# Patient Record
Sex: Male | Born: 1960 | Race: White | Hispanic: No | Marital: Married
Health system: Midwestern US, Academic
[De-identification: ages and names within clinical notes are randomized; demographics above are authoritative.]

---

## 2017-04-14 ENCOUNTER — Encounter: Admit: 2017-04-14 | Discharge: 2017-04-14 | Payer: Private health insurance—other commercial Indemnity

## 2017-04-14 ENCOUNTER — Ambulatory Visit: Admit: 2017-04-14 | Discharge: 2017-04-14 | Payer: BC Managed Care – PPO

## 2017-04-14 DIAGNOSIS — E785 Hyperlipidemia, unspecified: ICD-10-CM

## 2017-04-14 DIAGNOSIS — I10 Essential (primary) hypertension: Principal | ICD-10-CM

## 2017-04-14 DIAGNOSIS — Z87891 Personal history of nicotine dependence: ICD-10-CM

## 2017-04-14 DIAGNOSIS — E119 Type 2 diabetes mellitus without complications: Principal | ICD-10-CM

## 2017-04-14 DIAGNOSIS — G4730 Sleep apnea, unspecified: ICD-10-CM

## 2017-04-14 MED ORDER — METFORMIN 500 MG PO TB24
ORAL_TABLET | 3 refills | Status: AC
Start: 2017-04-14 — End: 2018-04-30

## 2017-04-14 NOTE — Progress Notes
Date of Service: 04/14/2017     Subjective:             Jeremy Bullock is a 56 y.o. male. He is here with his wife today.    History of Present Illness    Pt presents to clinic for management of T2 DM. This is the 6 mo f/u appointment.  He was last seen on 11/25/16    Interval changes:  He continues to do cardiac rehab 2 days weekly and has no new complaints since last visit.  A1C is 6.1 today, was previously 6.0 last visit.  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  Wt today is 302 lbs.  Was previously 306  Brought meter for review. I've reviewed meter download and BG patterns with pt- see printout in Careers information officer.   Check fasting daily.  Fasting BGs range 87-123, average 107 (SD 9)  Current glucose is 215, just ate lunch       T2 DM:   Dx:  2017  Current treatment: metformin XR 1000 mg BID  Medication adherent: all of the time   Past treatment: used lantus and novolog while inpt after CABG  SMBG and consistency: Checking daily am fasting, one touch verio  Hyperglycemia: none documented  Hypoglycemia: none on meter.  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   CHO intake:  Continues low carb meal plan  Exercise: yes, cardiac rehab and active at work and home.  Wears fitbit    Complications of DM:  CAD: yes, CABG in 2017  CVA: no  PVD: no  Amputations: no  Retinopathy: no  Gastropathy: no  Nephropathy: no  Neuropathy: no    Last dental exam within 6 mo: due now   Last eye exam within 12 mo:  Yes, 3/18    Medications:  Statin: yes  ACE-I or ARB: yes  ASA: yes    Past Medical History:   Diagnosis Date   ??? Dyslipidemia 05/22/2009   ??? Essential (primary) hypertension 05/22/2009   ??? History of tobacco use 05/22/2009   ??? Sleep apnea 05/22/2009     Past Surgical History:   Procedure Laterality Date ??? PERCUTANEOUS CORONARY INTERVENTION N/A 01/01/2016    Possible Percutaneous Coronary Intervention performed by Physician Cath at [facility]   ??? CORONARY ARTERY BYPASS GRAFT N/A 01/04/2016    BYPASS GRAFT CORONARY ARTERY X3,  EVH, LIMA performed by Hendricks Milo, MD at [facility]   ??? CORONARY ARTERY BYPASS GRAFT  01/04/16    three vessel   ??? PR COLONOSCOPY FLX DX W/COLLJ SPEC WHEN PFRMD N/A 01/08/2016    COLONOSCOPY performed by Everardo All, MD at [facility]     Family History   Problem Relation Age of Onset   ??? High Cholesterol Mother    ??? Diabetes Father    ??? Coronary Artery Disease Father      Social History     Social History   ??? Marital status: Married     Spouse name: N/A   ??? Number of children: N/A   ??? Years of education: N/A     Occupational History   ??? Not on file.     Social History Main Topics   ??? Smoking status: Never Smoker   ???  Smokeless tobacco: Never Used   ??? Alcohol use 0.6 - 1.2 oz/week     1 - 2 Cans of beer per week      Comment: occasionally   ??? Drug use: No   ??? Sexual activity: Not on file     Other Topics Concern   ??? Not on file     Social History Narrative   ??? No narrative on file       Labs:  Glucose, POC   Date/Time Value Ref Range Status   11/25/2016 11:07 AM 109  Final     Cholesterol   Date/Time Value Ref Range Status   11/25/2016 11:58 AM 90 <200 MG/DL Final     HDL   Date/Time Value Ref Range Status   11/25/2016 11:58 AM 39 (L) >40 MG/DL Final     LDL   Date/Time Value Ref Range Status   11/25/2016 11:58 AM 50 <100 MG/DL Final     Triglycerides   Date/Time Value Ref Range Status   11/25/2016 11:58 AM 66 <150 MG/DL Final     Non HDL Cholesterol   Date/Time Value Ref Range Status   11/25/2016 11:58 AM 51 MG/DL Final     Comment:     Calculated non-HDL Cholesterol (non-HDL-C) indirectly measures LDL-C, Lp(a),   IDL-C, and VLDL-C.  It is a surrogate marker for Apoprotein B.  Goal should be   less than 130 mg/dL.       Potassium   Date/Time Value Ref Range Status 11/25/2016 11:58 AM 4.4 3.5 - 5.1 MMOL/L Final     Blood Urea Nitrogen   Date/Time Value Ref Range Status   11/25/2016 11:58 AM 17 7 - 25 MG/DL Final     Creatinine   Date/Time Value Ref Range Status   11/25/2016 11:58 AM 1.17 0.4 - 1.24 MG/DL Final     AST (SGOT)   Date/Time Value Ref Range Status   11/25/2016 11:58 AM 17 7 - 40 U/L Final     ALT (SGPT)   Date/Time Value Ref Range Status   11/25/2016 11:58 AM 23 7 - 56 U/L Final     TSH   Date/Time Value Ref Range Status   11/25/2016 11:58 AM 1.173 0.35 - 5.00 MCU/ML Final     Vitamin D(25-OH)Total   Date/Time Value Ref Range Status   11/25/2016 11:58 AM 30.9 30 - 80 NG/ML Final            Review of Systems   Constitutional: Negative.    Respiratory: Negative for chest tightness and shortness of breath.    Cardiovascular: Positive for leg swelling (occasional). Negative for chest pain and palpitations.   Gastrointestinal: Negative for diarrhea and nausea.   Genitourinary: Negative for difficulty urinating.   All other systems reviewed and are negative.        Objective:         ??? aspirin EC 81 mg tablet Take 1 Tab by mouth daily. Take with food.   ??? atorvastatin (LIPITOR) 40 mg tablet Take 1 Tab by mouth daily.   ??? blood sugar diagnostic test strip 1 Strip twice daily before meals.   ??? metFORMIN-XR(+) (GLUCOPHAGE XR) 500 mg extended release tablet Take 2 tablets by mouth twice daily. Indications: type 2 diabetes mellitus   ??? ONE TOUCH DELICA 33 gauge MISC Use 1 Each as directed twice daily as needed. E11.65  Collaborating MD:  Dr. Henrine Screws   ??? sertraline (ZOLOFT) 50 mg tablet TAKE ONE TABLET  BY MOUTH ONCE DAILY   ??? valsartan-hydrochlorothiazide (DIOVAN HCT) 160-25 mg tablet Take 1 Tab by mouth daily.   ??? verapamil  SR (CALAN-SR) 180 mg tablet Take 1 Tab by mouth daily.     Vitals:    04/14/17 1255   BP: 119/66   Pulse: 68   Weight: (!) 137.1 kg (302 lb 3.2 oz)   Height: 185.4 cm (73)     Body mass index is 39.87 kg/m???.     Physical Exam Constitutional: He is oriented to person, place, and time. He appears well-developed and well-nourished.   HENT:   Head: Normocephalic and atraumatic.   Eyes: Conjunctivae and EOM are normal.   Neck: Normal range of motion.   Cardiovascular: Normal heart sounds.    Pulmonary/Chest: Effort normal and breath sounds normal.   Abdominal: Soft. Bowel sounds are normal. He exhibits no distension. There is no tenderness.   No lipohypertrophy noted in abd tissue   Musculoskeletal: Normal range of motion. He exhibits no edema.   Neurological: He is alert and oriented to person, place, and time.   Skin: Skin is warm and dry.   Has bruising from being kicked by a calf on left posterior lower extremity    Psychiatric: He has a normal mood and affect. His behavior is normal.     Diabetic foot exam on 11/25/16     Assessment and Plan:  1.T2 DM,  controlled . A1C is 6.1 today, was previously 6.0 last visit. 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  The body mass index is 39.87 kg/m???. 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    3. Hypertension at goal less than 130/80.  4. Lipid Continue lipitor 40 mg daily.  Repeat labs in January 2019.  5.  Diabetes microvascular complications: no history of retinopathy and MACR was normal November 25, 2016.      RTC in 6 mo      Total time 30 minutes.  Estimated counseling time 25 minutes.  Counseled patient regarding diabetes management.      Orders Placed This Encounter   ??? GLUCOSE METER DOWNLOAD   ??? POC HEMOGLOBIN A1C   ??? POC GLUCOSE QUANTITATIVE BLOOD   ??? metFORMIN-XR(+) (GLUCOPHAGE XR) 500 mg extended release tablet Patient Instructions     It was nice to see you today!    Goals we discussed today:    Nice to meet you today!  Decrease metformin to 1 tablet in the morning and 2 tablets at dinner    Please contact [facility] for any questions or abnormal glucose values (above 300 or less than 70 repeatedly).  [phone]   My nurse, Roanna Raider, can be reached at [phone]      Health goals for a person with diabetes to avoid complications are:    Check your FingerStick blood glucose:  Each time you take medications for your glucose, goals are:   Fasting and pre-meal glucose:  70-130 mg/dl   Bedtime blood glucose: 90-180 mg/dl    LDL Cholesterol:  <  100 mg/dl   Triglycerides: <161 mg/dl   HDL >09 for men, >60 for women   Blood pressure:  Less than 130/90 mm HG  Maintain a Healthy Weight , Exercise: 30 minutes 5 times per week    Foot care:  Take good care of your feet.  Never cut your nails close to the tips of the toes. Promptly call your doctor if you have an infection, ulcer or cut anywhere on your feet. Wear shoes with a wide toe box and that fit comfortably.  Avoid walking barefoot.     Your recent lab values:    Glucose, POC   Date/Time Value Ref Range Status   11/25/2016 11:07 AM 109  Final     Cholesterol   Date/Time Value Ref Range Status   11/25/2016 11:58 AM 90 <200 MG/DL Final     HDL   Date/Time Value Ref Range Status   11/25/2016 11:58 AM 39 (L) >40 MG/DL Final     LDL   Date/Time Value Ref Range Status   11/25/2016 11:58 AM 50 <100 MG/DL Final     Triglycerides   Date/Time Value Ref Range Status   11/25/2016 11:58 AM 66 <150 MG/DL Final          Diabetes Support Group  Monthly DM Support Group facilitated by CDE. There is no fee or  Registration required for attendance. Meets the first Wednesday of each month from 6-7:30pm at the [facility], [address], [address], [place], [place]  [address].    [facility] To schedule Diabetes Education please call [phone], Option 3  Contact your insurance company to determine coverage for diabetes education.            No future appointments.

## 2017-06-06 ENCOUNTER — Encounter: Admit: 2017-06-06 | Discharge: 2017-06-06 | Payer: Private Health Insurance - Indemnity

## 2017-06-07 MED ORDER — ONETOUCH VERIO MISC STRP
ORAL_STRIP | Freq: Two times a day (BID) | 2 refills | 30.00000 days | Status: AC
Start: 2017-06-07 — End: 2020-01-31

## 2017-06-14 ENCOUNTER — Encounter: Admit: 2017-06-14 | Discharge: 2017-06-14 | Payer: Private health insurance—other commercial Indemnity

## 2017-06-19 MED ORDER — ONETOUCH VERIO MISC STRP
ORAL_STRIP | Freq: Two times a day (BID) | 2 refills | 30.00000 days | Status: AC
Start: 2017-06-19 — End: 2020-01-31

## 2017-07-13 ENCOUNTER — Encounter: Admit: 2017-07-13 | Discharge: 2017-07-13 | Payer: Private Health Insurance - Indemnity

## 2017-07-13 ENCOUNTER — Ambulatory Visit: Admit: 2017-07-13 | Discharge: 2017-07-14 | Payer: BC Managed Care – PPO

## 2017-07-13 ENCOUNTER — Encounter: Admit: 2017-07-13 | Discharge: 2017-07-14

## 2017-07-13 DIAGNOSIS — E785 Hyperlipidemia, unspecified: ICD-10-CM

## 2017-07-13 DIAGNOSIS — I2510 Atherosclerotic heart disease of native coronary artery without angina pectoris: ICD-10-CM

## 2017-07-13 DIAGNOSIS — G4733 Obstructive sleep apnea (adult) (pediatric): ICD-10-CM

## 2017-07-13 DIAGNOSIS — Z87891 Personal history of nicotine dependence: ICD-10-CM

## 2017-07-13 DIAGNOSIS — G4730 Sleep apnea, unspecified: ICD-10-CM

## 2017-07-13 DIAGNOSIS — R9389 Abnormal findings on diagnostic imaging of other specified body structures: Principal | ICD-10-CM

## 2017-07-13 DIAGNOSIS — I10 Essential (primary) hypertension: Principal | ICD-10-CM

## 2017-07-13 DIAGNOSIS — R69 Illness, unspecified: Principal | ICD-10-CM

## 2017-07-13 NOTE — Assessment & Plan Note
Blood pressure treated to goal on current medical program.

## 2017-07-13 NOTE — Progress Notes
Date of Service: 07/13/2017    Jeremy Bullock is a 56 y.o. male.       HPI     Jeremy Bullock was in the [place] office today with his wife.  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    I think he is doing well from a cardiovascular standpoint.  He is being followed down at the [facility] and his hemoglobin A1c levels have definitely improved.    He is still doing Phase 3 Cardiac Rehab occasionally.  He obviously works really hard and he says that he has had none of the chest tightness that he had prior to his bypass operation last year.    He denies any TIA or stroke symptoms.  He has had no palpitations, syncope, or near syncope.    He does describe some sense of leg discomfort and movement during the last couple of hours of sleep.  Little bit of an unusual symptom for restless legs but I think that is probably what he is describing and I suggested that he talk to Dr. Doran Heater about it.    He has undergone some type of screening pulmonary testing at work and there is some concern about an abnormal chest x-ray and abnormal spirometry.  He is planning to have a CT chest later this afternoon.         Vitals:    07/13/17 0830 07/13/17 0836   BP: 132/84 130/88   Pulse: 60    Weight: (!) 140.5 kg (309 lb 12.8 oz)    Height: 1.854 m (6' 1)      Body mass index is 40.87 kg/m???.     Past Medical History  Patient Active Problem List    Diagnosis Date Noted   ??? Abnormal chest x-ray 07/13/2017   ??? Coronary artery disease due to calcified coronary lesion 02/11/2016     01/2016 - CABG     ??? Type 2 diabetes mellitus (HCC) 02/19/2015     07/2014 - Fasting glucose 162.  Hgb A1c 7.4%       ??? Morbid obesity (HCC) 08/02/2011   ??? Essential (primary) hypertension 05/22/2009   ??? History of tobacco use 05/22/2009   ??? Dyslipidemia 05/22/2009     borderline     ??? Sleep apnea 05/22/2009 2004 - Diagnosis by sleep study at [facility], ordered by Dr. Gwenette Greet.  CPAP started.    09/2012 - Office consultation with Dr. Lucilla Lame.  Follow-up sleep study scheduled for 10/2012.           Review of Systems   Constitution: Negative.   HENT: Negative.    Eyes: Negative.    Cardiovascular: Positive for claudication.   Respiratory: Negative.    Endocrine: Negative.    Hematologic/Lymphatic: Negative.    Skin: Negative.    Musculoskeletal: Negative.    Gastrointestinal: Negative.    Genitourinary: Negative.    Neurological: Negative.    Psychiatric/Behavioral: Negative.    Allergic/Immunologic: Negative.        Physical Exam    Physical Exam   General Appearance: no distress   Skin: warm, no ulcers or xanthomas   Digits and Nails: no cyanosis or clubbing   Eyes: conjunctivae and lids normal, pupils are equal and round   Teeth/Gums/Palate: dentition unremarkable, no lesions   Lips & Oral Mucosa: no pallor or cyanosis  Neck Veins: normal JVP , neck veins are not distended   Thyroid: no nodules, masses, tenderness or enlargement   Chest Inspection: chest is normal in appearance   Respiratory Effort: breathing comfortably, no respiratory distress   Auscultation/Percussion: lungs clear to auscultation, no rales or rhonchi, no wheezing   PMI: PMI not enlarged or displaced   Cardiac Rhythm: regular rhythm and normal rate   Cardiac Auscultation: S1, S2 normal, no rub, no gallop   Murmurs: no murmur   Peripheral Circulation: normal peripheral circulation   Carotid Arteries: normal carotid upstroke bilaterally, no bruits   Radial Arteries: normal symmetric radial pulses   Abdominal Aorta: no abdominal aortic bruit   Pedal Pulses: normal symmetric pedal pulses   Lower Extremity Edema: no lower extremity edema   Abdominal Exam: soft, non-tender, no masses, bowel sounds normal   Liver & Spleen: no organomegaly   Gait & Station: walks without assistance   Muscle Strength: normal muscle tone Orientation: oriented to time, place and person   Affect & Mood: appropriate and sustained affect   Language and Memory: patient responsive and seems to comprehend information   Neurologic Exam: neurological assessment grossly intact   Other: moves all extremities      Problems Addressed Today  Encounter Diagnoses   Name Primary?   ??? Abnormal chest x-ray    ??? Coronary artery disease due to calcified coronary lesion    ??? Dyslipidemia    ??? Essential (primary) hypertension    ??? Obstructive sleep apnea syndrome        Assessment and Plan       Abnormal chest x-ray  He has some sort of chest x-ray abnormality and abnormal PFT's for which he's having a CT-chest later today.  He'll probably need to get in to see Dr. Doran Heater for follow-up.    Coronary artery disease due to calcified coronary lesion  We talked at some length today about how to monitor for progression of his coronary disease.  We will also little surprised that the extent of his disease when he underwent his preoperative catheterization but in retrospect he probably was having fairly typical angina symptoms for a year.  Stress testing was never particularly predictive for him and he thinks it is because they did not working hard enough on the treadmill compared to what he would do on his own out of the real world.    We left it that we would simply monitor him for a return of his chest discomfort symptoms with exertion.  Should this happen he is to count patient that I would probably refer for catheterization at that point rather than doing a stress test.    Dyslipidemia  Lab Results   Component Value Date    CHOL 90 11/25/2016    TRIG 66 11/25/2016    HDL 39 (L) 11/25/2016    LDL 50 11/25/2016    VLDL 13 11/25/2016    NONHDLCHOL 51 11/25/2016    CHOLHDLC 4 02/25/2015      LDL treated to goal.    Essential (primary) hypertension  Blood pressure treated to goal on current medical program.    Sleep apnea It sounds like his CPAP device is working well for him but he seems to have some type of a restless legs problem but I have encouraged him to talk to Dr. Doran Heater about.      Current Medications (including today's revisions)  ??? aspirin EC 81 mg tablet Take 1 Tab by mouth daily.  Take with food.   ??? atorvastatin (LIPITOR) 40 mg tablet Take 1 Tab by mouth daily.   ??? metFORMIN-XR(+) (GLUCOPHAGE XR) 500 mg extended release tablet 500 mg in the morning with breakfast  1000 mg in the evening with dinner   ??? ONE TOUCH DELICA 33 gauge MISC Use 1 Each as directed twice daily as needed. E11.65  Collaborating MD:  Dr. Henrine Screws   ??? ONETOUCH VERIO test strip USE ONE STRIP TO CHECK GLUCOSE TWICE DAILY BEFORE MEAL(S)   ??? ONETOUCH VERIO test strip USE ONE STRIP TO CHECK GLUCOSE TWICE DAILY BEFORE MEAL(S)   ??? sertraline (ZOLOFT) 50 mg tablet TAKE ONE TABLET BY MOUTH ONCE DAILY   ??? valsartan-hydrochlorothiazide (DIOVAN HCT) 160-25 mg tablet Take 1 Tab by mouth daily.   ??? verapamil  SR (CALAN-SR) 180 mg tablet Take 1 Tab by mouth daily.

## 2017-07-13 NOTE — Assessment & Plan Note
It sounds like his CPAP device is working well for him but he seems to have some type of a restless legs problem but I have encouraged him to talk to Dr. Doran HeaterMitchem about.

## 2017-07-13 NOTE — Assessment & Plan Note
He has some sort of chest x-ray abnormality and abnormal PFT's for which he's having a CT-chest later today.  He'll probably need to get in to see Dr. Doran HeaterMitchem for follow-up.

## 2017-07-13 NOTE — Assessment & Plan Note
We talked at some length today about how to monitor for progression of his coronary disease.  We will also little surprised that the extent of his disease when he underwent his preoperative catheterization but in retrospect he probably was having fairly typical angina symptoms for a year.  Stress testing was never particularly predictive for him and he thinks it is because they did not working hard enough on the treadmill compared to what he would do on his own out of the real world.    We left it that we would simply monitor him for a return of his chest discomfort symptoms with exertion.  Should this happen he is to count patient that I would probably refer for catheterization at that point rather than doing a stress test.

## 2017-07-13 NOTE — Assessment & Plan Note
Lab Results   Component Value Date    CHOL 90 11/25/2016    TRIG 66 11/25/2016    HDL 39 (L) 11/25/2016    LDL 50 11/25/2016    VLDL 13 11/25/2016    NONHDLCHOL 51 11/25/2016    CHOLHDLC 4 02/25/2015      LDL treated to goal.

## 2017-08-28 ENCOUNTER — Encounter: Admit: 2017-08-28 | Discharge: 2017-08-28 | Payer: Private health insurance—other commercial Indemnity

## 2017-08-28 DIAGNOSIS — J984 Other disorders of lung: Principal | ICD-10-CM

## 2017-08-31 ENCOUNTER — Encounter: Admit: 2017-08-31 | Discharge: 2017-08-31 | Payer: Private health insurance—other commercial Indemnity

## 2017-08-31 DIAGNOSIS — F419 Anxiety disorder, unspecified: ICD-10-CM

## 2017-08-31 DIAGNOSIS — Z87891 Personal history of nicotine dependence: ICD-10-CM

## 2017-08-31 DIAGNOSIS — G4730 Sleep apnea, unspecified: ICD-10-CM

## 2017-08-31 DIAGNOSIS — F329 Major depressive disorder, single episode, unspecified: ICD-10-CM

## 2017-08-31 DIAGNOSIS — I10 Essential (primary) hypertension: Principal | ICD-10-CM

## 2017-08-31 DIAGNOSIS — E785 Hyperlipidemia, unspecified: ICD-10-CM

## 2017-09-04 ENCOUNTER — Encounter: Admit: 2017-09-04 | Discharge: 2017-09-04 | Payer: Private Health Insurance - Indemnity

## 2017-09-04 ENCOUNTER — Ambulatory Visit: Admit: 2017-09-04 | Discharge: 2017-09-04 | Payer: BC Managed Care – PPO

## 2017-09-04 DIAGNOSIS — I10 Essential (primary) hypertension: Principal | ICD-10-CM

## 2017-09-04 DIAGNOSIS — E119 Type 2 diabetes mellitus without complications: ICD-10-CM

## 2017-09-04 DIAGNOSIS — F419 Anxiety disorder, unspecified: ICD-10-CM

## 2017-09-04 DIAGNOSIS — F329 Major depressive disorder, single episode, unspecified: ICD-10-CM

## 2017-09-04 DIAGNOSIS — R0602 Shortness of breath: Principal | ICD-10-CM

## 2017-09-04 DIAGNOSIS — G4733 Obstructive sleep apnea (adult) (pediatric): Principal | ICD-10-CM

## 2017-09-04 DIAGNOSIS — Z87891 Personal history of nicotine dependence: ICD-10-CM

## 2017-09-04 DIAGNOSIS — R9389 Abnormal findings on diagnostic imaging of other specified body structures: ICD-10-CM

## 2017-09-04 DIAGNOSIS — I2510 Atherosclerotic heart disease of native coronary artery without angina pectoris: ICD-10-CM

## 2017-09-04 DIAGNOSIS — G4730 Sleep apnea, unspecified: ICD-10-CM

## 2017-09-04 DIAGNOSIS — E785 Hyperlipidemia, unspecified: ICD-10-CM

## 2017-09-04 NOTE — Progress Notes
Subjective:       History of Present Illness  Mr. Prohaska was referred for evaluation of his decreased FEV1 and his ability to wear a respirator.  He is a non-smoker and has worked at [employer]  for many years.  He is a Teaching laboratory technician.  He infrequently wears a dust mask.  He has pulmonary function on a yearly basis and it has been declining.(results not available)  He relates he does get short of breath with exercise.  He does have a congested cough in the morning.  He is on no specific medications for his cough.  He denies any significant sputum production, wheezing, hemoptysis ,fatigue or chest pain.  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  His medications are as outlined He is on CPAP for his OSA       Review of Systems   Constitutional: Negative.         Patient does yearly spirometry at work which had shown a decline.  Patient uses a dust mask at work PRN.   HENT: Positive for rhinorrhea.         Some seasonal allergies   Eyes: Positive for itching.   Respiratory: Positive for cough (worst in the morning.  sputum is white to yellow.  Denies hemoptysis.) and shortness of breath.         Patient reports that he has had productive cough and chest congestion for approximately 1 year.   Cardiovascular: Negative.    Gastrointestinal: Negative.    Endocrine: Negative.    Genitourinary: Negative.    Musculoskeletal: Negative.    Skin: Negative.    Allergic/Immunologic: Negative.    Neurological: Negative.    Hematological: Negative.    Psychiatric/Behavioral: Negative.    All other systems reviewed and are negative.        Objective:         ??? aspirin EC 81 mg tablet Take 1 Tab by mouth daily. Take with food.   ??? atorvastatin (LIPITOR) 40 mg tablet Take 1 Tab by mouth daily.   ??? metFORMIN-XR(+) (GLUCOPHAGE XR) 500 mg extended release tablet 500 mg in the morning with breakfast  1000 mg in the evening with dinner ??? ONE TOUCH DELICA 33 gauge MISC Use 1 Each as directed twice daily as needed. E11.65  Collaborating MD:  Dr. Henrine Screws   ??? ONETOUCH VERIO test strip USE ONE STRIP TO CHECK GLUCOSE TWICE DAILY BEFORE MEAL(S)   ??? ONETOUCH VERIO test strip USE ONE STRIP TO CHECK GLUCOSE TWICE DAILY BEFORE MEAL(S)   ??? sertraline (ZOLOFT) 50 mg tablet TAKE ONE TABLET BY MOUTH ONCE DAILY   ??? valsartan-hydrochlorothiazide (DIOVAN HCT) 160-25 mg tablet Take 1 Tab by mouth daily.   ??? verapamil  SR (CALAN-SR) 180 mg tablet Take 1 Tab by mouth daily.     Vitals:    09/04/17 1328   BP: 131/71   Pulse: 68   Resp: 16   Temp: 37.1 ???C (98.8 ???F)   TempSrc: Oral   SpO2: 96%   Weight: (!) 138.3 kg (305 lb)   Height: 182.9 cm (72)     Body mass index is 41.37 kg/m???.     Physical Exam   Constitutional: He is oriented to person, place, and time.   Obese man in NAD   HENT:   Head: Normocephalic and atraumatic.   Mouth/Throat: Oropharynx is clear and moist.   Eyes: Conjunctivae and  EOM are normal. Pupils are equal, round, and reactive to light.   Neck: Normal range of motion.   Cardiovascular: Normal rate and regular rhythm.   Pulmonary/Chest: Effort normal and breath sounds normal.   Abdominal: Soft. Bowel sounds are normal.   Musculoskeletal: He exhibits no edema.   Neurological: He is alert and oriented to person, place, and time.   Skin: Skin is warm and dry.   Psychiatric: He has a normal mood and affect. His behavior is normal.   Vitals reviewed.              Assessment and Plan:  #1 mild restrictive pulmonary disease.  His restriction is a reflection of his obesity and postop pleural changes in his left hemithorax as a result of his coronary artery surgery in 2017.  His FEV1 dropped from the 80s into the 70s.  I see no reason that he cannot wear a respirator.  His shortness of breath is out of proportion to his pulmonary function testing.  I contacted Dr. Edwinna Areola in cardiology to determine if we can get results of a cardiac stress test from [facility].  He did not feel that was possible . Therefore, I feel we should do a cardiopulmonary exercise test to further evaluate his symptoms.    #2 obstructive sleep apnea on CPAP

## 2017-09-07 ENCOUNTER — Encounter: Admit: 2017-09-07 | Discharge: 2017-09-07 | Payer: Private Health Insurance - Indemnity

## 2017-09-07 NOTE — Telephone Encounter
Historical PFTs received from [facility] and given to [facility] for review.    CTchest has been clouded for comparison.      Routing to Dr.Barkman to review and determine if patient needs follow up appointment.

## 2017-09-08 ENCOUNTER — Encounter: Admit: 2017-09-08 | Discharge: 2017-09-08 | Payer: Private Health Insurance - Indemnity

## 2017-09-08 DIAGNOSIS — R69 Illness, unspecified: Principal | ICD-10-CM

## 2017-09-13 ENCOUNTER — Encounter: Admit: 2017-09-13 | Discharge: 2017-09-13 | Payer: Private health insurance—other commercial Indemnity

## 2017-09-13 DIAGNOSIS — R0602 Shortness of breath: Principal | ICD-10-CM

## 2017-09-13 NOTE — Telephone Encounter
Patient's wife LVM asking if Dr.Barkman has reviewed the outside imaging and records and what the next step is?  Routing to [employer] InternationalDr.Barkman.

## 2017-09-14 NOTE — Telephone Encounter
Wife LVM asking if Dr.Barkman has reviewed the outside imaging and records and what the next step is?  Routing to [employer].Barkman.

## 2017-09-15 NOTE — Telephone Encounter
Patient LVM x 2 requesting plan with Dr.Barkman.

## 2017-09-19 NOTE — Telephone Encounter
Dr.Barkman called and spoke to patient's wife and discussed plan for CPX.

## 2017-10-18 ENCOUNTER — Ambulatory Visit: Admit: 2017-10-18 | Discharge: 2017-10-18 | Payer: BC Managed Care – PPO

## 2017-10-18 DIAGNOSIS — R0602 Shortness of breath: Principal | ICD-10-CM

## 2017-10-18 LAB — BLOOD GASES, ARTERIAL
Lab: 3.1 MMOL/L
Lab: 41 mmHg (ref 35–45)
Lab: 7.4 (ref 7.35–7.45)

## 2017-10-18 LAB — CARBON MONOXIDE,BG: Lab: 1.5 % (ref <2.0)

## 2017-10-18 LAB — HEMOGLOBIN & HEMATOCRIT, BG
Lab: 16 g/dL (ref 13.5–16.5)
Lab: 49 % (ref 40–50)

## 2017-10-18 LAB — METHEMOGLOBIN: Lab: 0.8 % (ref <1.5)

## 2017-11-03 ENCOUNTER — Encounter: Admit: 2017-11-03 | Discharge: 2017-11-03 | Payer: Private health insurance—other commercial Indemnity

## 2017-11-03 NOTE — Telephone Encounter
Patient's wife, Kennon RoundsSally, LVM requesting 10/18/17 CPX results.    Called patient's wife.  Informed her that Dr.Barkman is out of the office this week, but I will discuss results with him when he returns next week and either he or I will call them with the results.

## 2017-11-06 ENCOUNTER — Encounter: Admit: 2017-11-06 | Discharge: 2017-11-06 | Payer: Private Health Insurance - Indemnity

## 2017-11-06 DIAGNOSIS — I10 Essential (primary) hypertension: Principal | ICD-10-CM

## 2017-11-06 DIAGNOSIS — E119 Type 2 diabetes mellitus without complications: ICD-10-CM

## 2017-11-06 DIAGNOSIS — E785 Hyperlipidemia, unspecified: ICD-10-CM

## 2017-11-06 DIAGNOSIS — Z87891 Personal history of nicotine dependence: ICD-10-CM

## 2017-11-06 DIAGNOSIS — G4730 Sleep apnea, unspecified: ICD-10-CM

## 2017-11-06 DIAGNOSIS — F329 Major depressive disorder, single episode, unspecified: ICD-10-CM

## 2017-11-06 DIAGNOSIS — F419 Anxiety disorder, unspecified: ICD-10-CM

## 2017-11-06 DIAGNOSIS — I2510 Atherosclerotic heart disease of native coronary artery without angina pectoris: ICD-10-CM

## 2017-11-06 DIAGNOSIS — G4733 Obstructive sleep apnea (adult) (pediatric): ICD-10-CM

## 2017-11-09 NOTE — Telephone Encounter
Patient's wife, Kennon Rounds, LVM requesting 10/18/17 CPX results.    Dr.Barkman notified.

## 2017-11-10 NOTE — Telephone Encounter
Dr.Barkman called patient's wife with CPX results.  Patient will come back to see Dr.Barkman PRN.

## 2018-02-08 ENCOUNTER — Ambulatory Visit: Admit: 2018-02-08 | Discharge: 2018-02-09 | Payer: Private Health Insurance - Indemnity

## 2018-02-08 ENCOUNTER — Encounter: Admit: 2018-02-08 | Discharge: 2018-02-08 | Payer: Private Health Insurance - Indemnity

## 2018-02-08 ENCOUNTER — Encounter: Admit: 2018-02-08 | Discharge: 2018-02-08 | Payer: Private health insurance—other commercial Indemnity

## 2018-02-08 DIAGNOSIS — F329 Major depressive disorder, single episode, unspecified: ICD-10-CM

## 2018-02-08 DIAGNOSIS — F419 Anxiety disorder, unspecified: ICD-10-CM

## 2018-02-08 DIAGNOSIS — I10 Essential (primary) hypertension: ICD-10-CM

## 2018-02-08 DIAGNOSIS — I2510 Atherosclerotic heart disease of native coronary artery without angina pectoris: ICD-10-CM

## 2018-02-08 DIAGNOSIS — E7800 Pure hypercholesterolemia, unspecified: ICD-10-CM

## 2018-02-08 DIAGNOSIS — E119 Type 2 diabetes mellitus without complications: ICD-10-CM

## 2018-02-08 DIAGNOSIS — G4730 Sleep apnea, unspecified: ICD-10-CM

## 2018-02-08 DIAGNOSIS — G4733 Obstructive sleep apnea (adult) (pediatric): ICD-10-CM

## 2018-02-08 DIAGNOSIS — E785 Hyperlipidemia, unspecified: ICD-10-CM

## 2018-02-08 DIAGNOSIS — Z87891 Personal history of nicotine dependence: ICD-10-CM

## 2018-02-08 MED ORDER — VALSARTAN 160 MG PO TAB
160 mg | ORAL_TABLET | Freq: Every day | ORAL | 3 refills | 60.00000 days | Status: AC
Start: 2018-02-08 — End: 2018-05-08

## 2018-04-30 ENCOUNTER — Encounter: Admit: 2018-04-30 | Discharge: 2018-04-30 | Payer: Private health insurance—other commercial Indemnity

## 2018-04-30 DIAGNOSIS — E119 Type 2 diabetes mellitus without complications: Principal | ICD-10-CM

## 2018-04-30 MED ORDER — METFORMIN 500 MG PO TB24
ORAL_TABLET | 15 refills | Status: AC
Start: 2018-04-30 — End: 2019-06-07

## 2018-05-08 ENCOUNTER — Encounter: Admit: 2018-05-08 | Discharge: 2018-05-08 | Payer: Private Health Insurance - Indemnity

## 2018-05-08 ENCOUNTER — Ambulatory Visit: Admit: 2018-05-08 | Discharge: 2018-05-09 | Payer: Private Health Insurance - Indemnity

## 2018-05-08 DIAGNOSIS — I10 Essential (primary) hypertension: Principal | ICD-10-CM

## 2018-05-08 DIAGNOSIS — E785 Hyperlipidemia, unspecified: ICD-10-CM

## 2018-05-08 DIAGNOSIS — F419 Anxiety disorder, unspecified: ICD-10-CM

## 2018-05-08 DIAGNOSIS — E119 Type 2 diabetes mellitus without complications: ICD-10-CM

## 2018-05-08 DIAGNOSIS — G4730 Sleep apnea, unspecified: ICD-10-CM

## 2018-05-08 DIAGNOSIS — F329 Major depressive disorder, single episode, unspecified: ICD-10-CM

## 2018-05-08 DIAGNOSIS — I2510 Atherosclerotic heart disease of native coronary artery without angina pectoris: ICD-10-CM

## 2018-05-08 DIAGNOSIS — Z87891 Personal history of nicotine dependence: ICD-10-CM

## 2018-05-08 DIAGNOSIS — G4733 Obstructive sleep apnea (adult) (pediatric): ICD-10-CM

## 2018-05-08 MED ORDER — VERAPAMIL 180 MG PO TBER
180 mg | ORAL_TABLET | Freq: Every day | ORAL | 3 refills | 90.00000 days | Status: AC
Start: 2018-05-08 — End: ?

## 2018-05-08 MED ORDER — HYDROCHLOROTHIAZIDE 25 MG PO TAB
25 mg | ORAL_TABLET | Freq: Every day | ORAL | 3 refills | 28.00000 days | Status: AC
Start: 2018-05-08 — End: 2019-05-14

## 2018-05-08 MED ORDER — ATORVASTATIN 40 MG PO TAB
40 mg | ORAL_TABLET | Freq: Every day | ORAL | 3 refills | Status: AC
Start: 2018-05-08 — End: ?

## 2018-05-08 MED ORDER — VALSARTAN 160 MG PO TAB
160 mg | ORAL_TABLET | Freq: Every day | ORAL | 3 refills | 60.00000 days | Status: AC
Start: 2018-05-08 — End: 2019-06-12

## 2018-05-21 ENCOUNTER — Encounter: Admit: 2018-05-21 | Discharge: 2018-05-21 | Payer: Private Health Insurance - Indemnity

## 2018-05-21 DIAGNOSIS — I10 Essential (primary) hypertension: Principal | ICD-10-CM

## 2018-05-21 DIAGNOSIS — E785 Hyperlipidemia, unspecified: ICD-10-CM

## 2018-05-21 LAB — LIPID PROFILE: Lab: 95 /HPF — ABNORMAL LOW (ref 150–200)

## 2018-05-21 LAB — BASIC METABOLIC PANEL
Lab: 13
Lab: 104
Lab: 140 (ref 5.0–8.0)
Lab: 27
Lab: 3.9 — ABNORMAL LOW (ref 35–60)

## 2018-10-08 IMAGING — CT Thorax^1_CHEST_WITH (Adult)
2 series · 15 of 34 positions shown, 18 images · IV contrast (APPLIED)
Comparison: none

[Series 2: chest with 5.0 soft tissue · axial · 0.84mm/px · z∈[-342,-52]mm · 12 of 68 slices shown, 15 images]
[im 5/68  mediastinal]
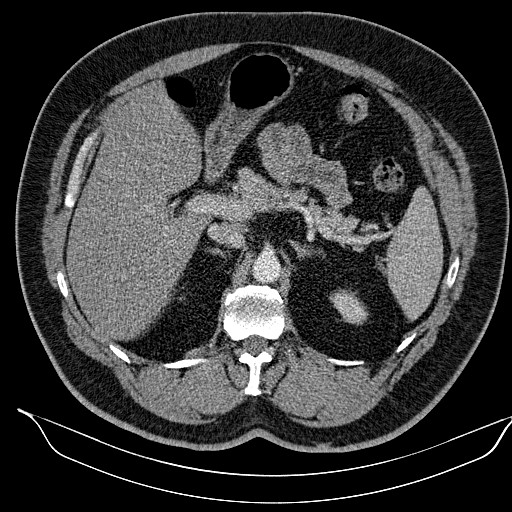
[im 5/68  lung]
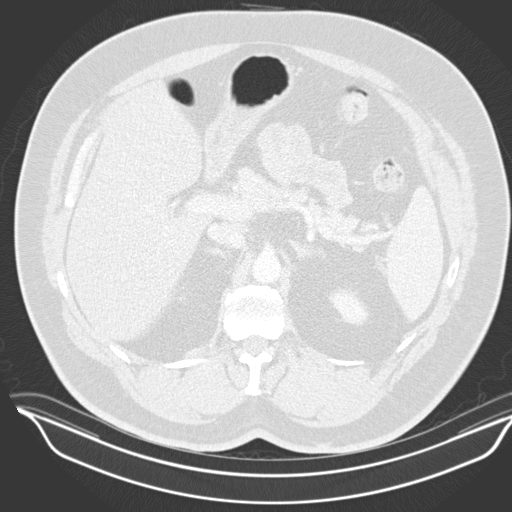
[im 10/68  lung]
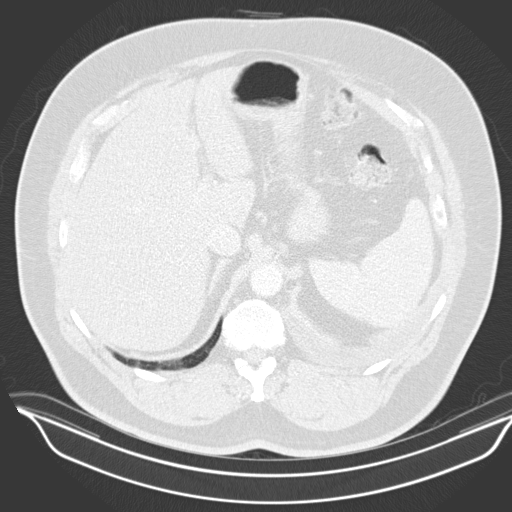
[im 15/68  lung]
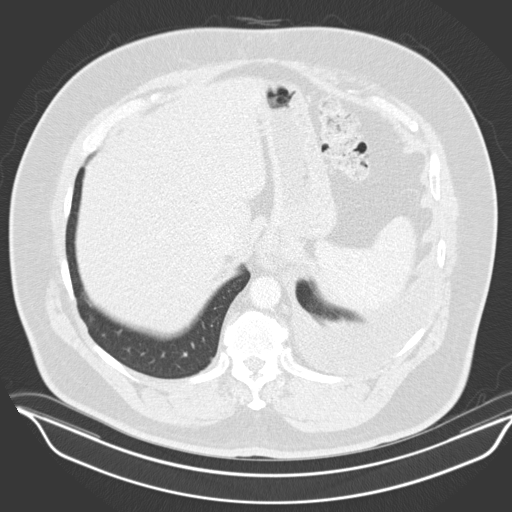
[im 20/68  lung]
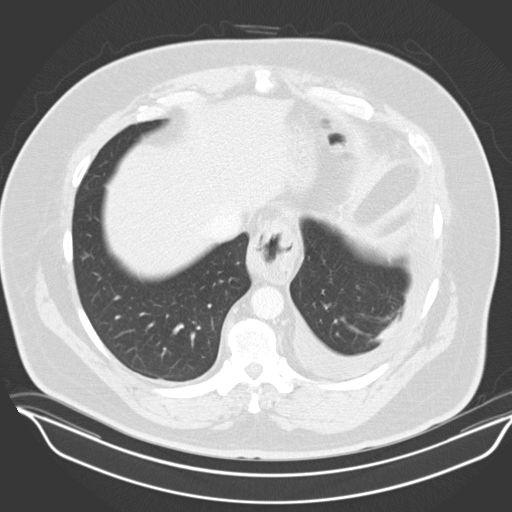
[im 25/68  mediastinal]
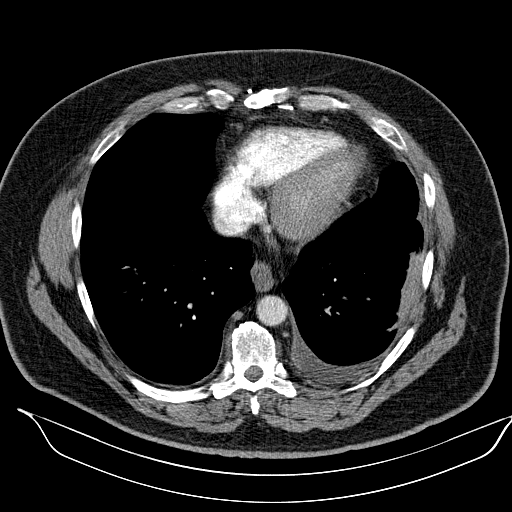
[im 25/68  lung]
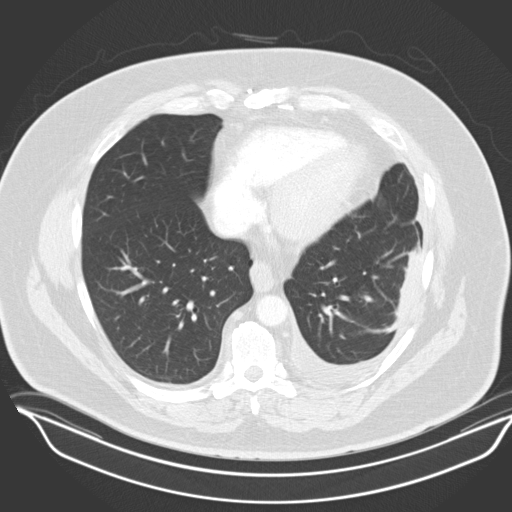
[im 30/68  lung]
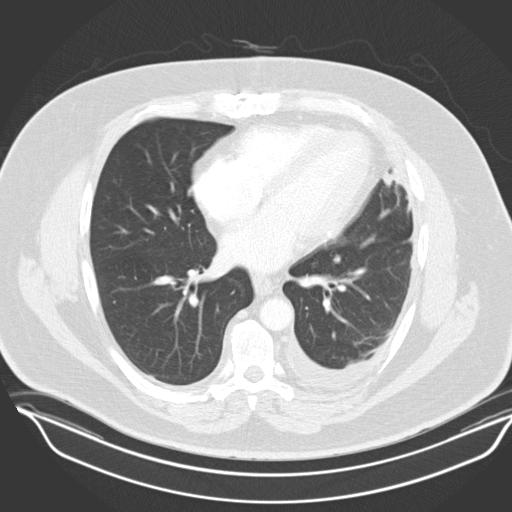
[im 38/68  lung]
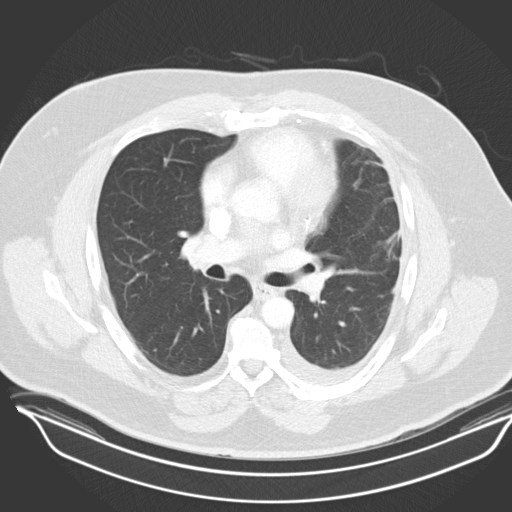
[im 43/68  lung]
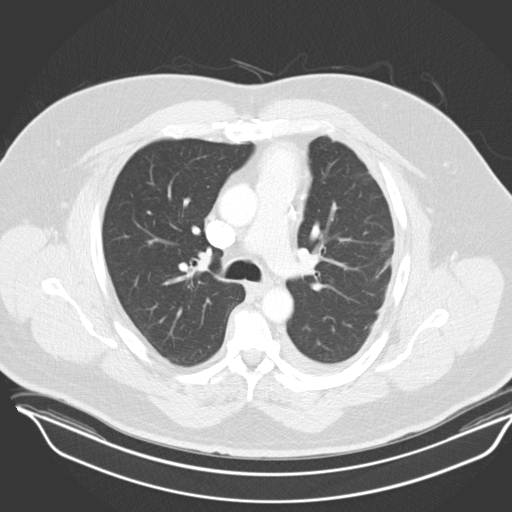
[im 48/68  mediastinal]
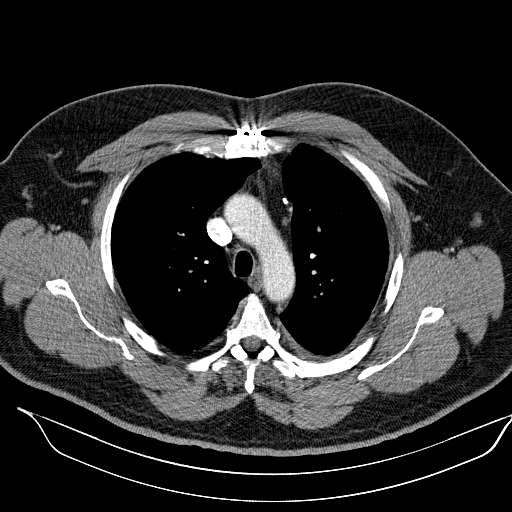
[im 48/68  lung]
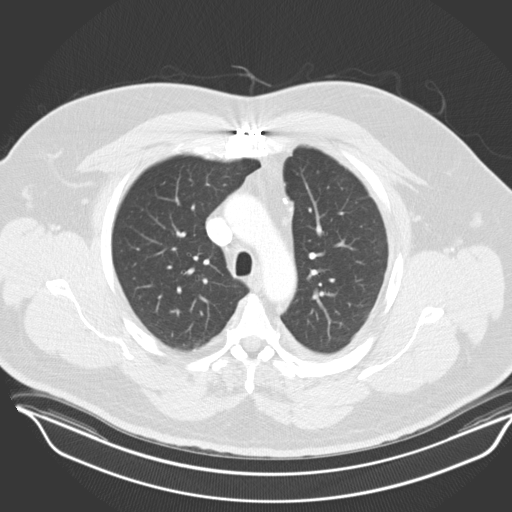
[im 53/68  lung]
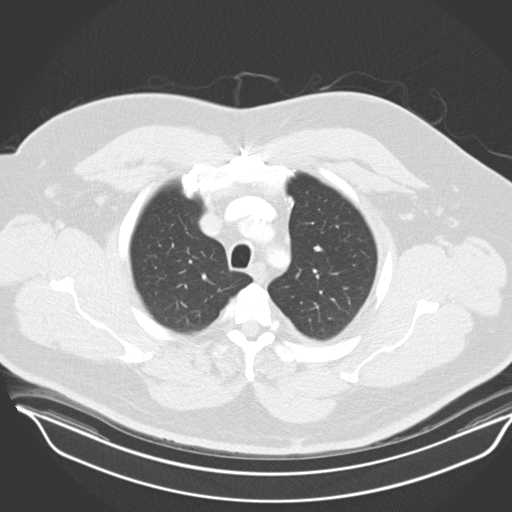
[im 58/68  lung]
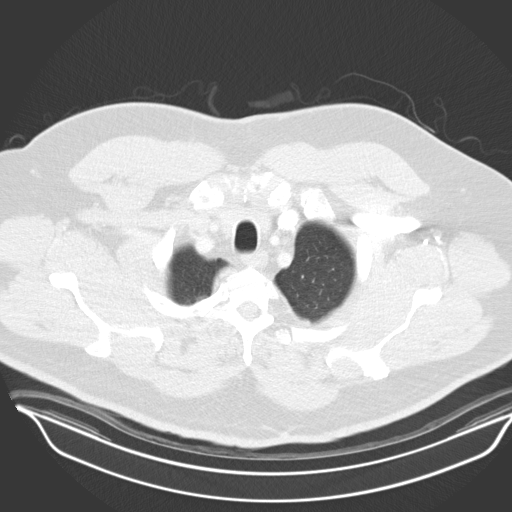
[im 63/68  lung]
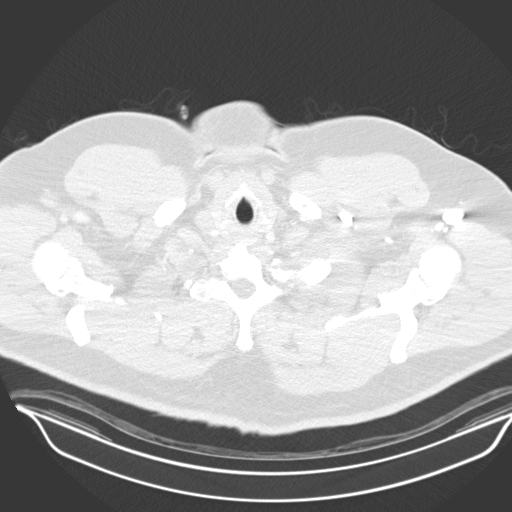

[Series 4: chest with 5.0 coronal · coronal · 0.84mm/px · 3 of 69 slices shown]
[im 14/69  lung]
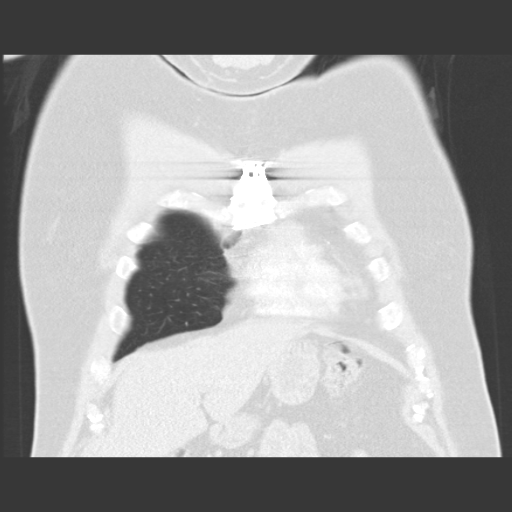
[im 28/69  lung]
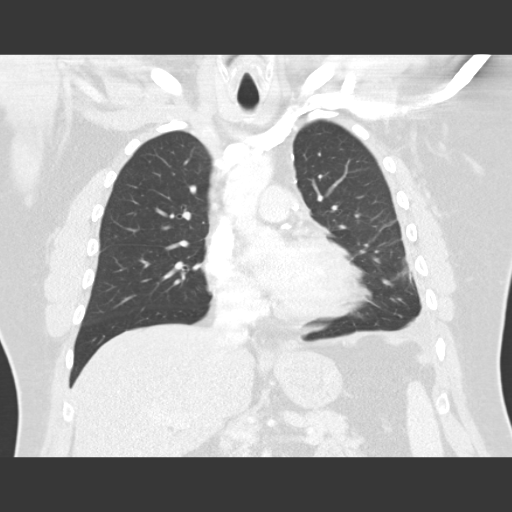
[im 41/69  lung]
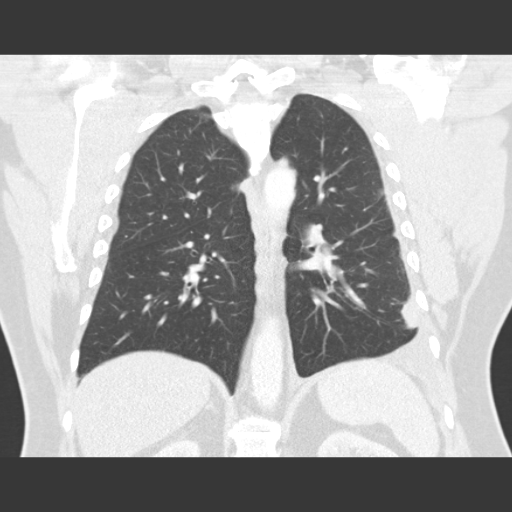

[15 of 34 positions shown; findings below may reference images not displayed]

DIAGNOSTIC STUDIES

EXAM

CT chest with contrast

INDICATION

SOA/lung tissue scarring
F/U LUNG SCARRING FROM CXR. SOA. HX: CABG. GFR. 74 AHHX1 OMNI 300

TECHNIQUE

Volumetric multi detector CT images of the chest are obtained after the administration of 100 cc
Omnipaque 300 low osmolar intravenous contrast

All CT scans at this facility use dose modulation, iterative reconstruction, and/or weight based
dosing when appropriate to reduce radiation dose to as low as reasonably achievable.

COMPARISONS

Two-view chest July 05, 2017.

FINDINGS

The thoracic inlet is grossly unremarkable. Postoperative changes of the mediastinum status post
coronary artery bypass grafting is appreciated the thoracic aorta is non aneurysmal. There is no
central filling defect to suggest pulmonary embolus . Otherwise, there is no evidence of mediastinal
, hilar or axillary adenopathy. There is a small left basilar pleural effusion appreciated with
somewhat on plaque nodular rounded atelectatic lung parenchyma. There is trace right basilar
atelectasis and/or parenchymal scarring.. There is no dominant pulmonary mass. The thoracic spine
is demonstrates mild degenerative changes with diffuse flowing anterior osteophytes. The partially
visualized upper abdominal viscera demonstrates mild hiatal hernia..

IMPRESSION

1. Demonstration of a small left basilar pleural effusion with adjacent somewhat rounded
atelectasis..

2. There is no other acute abnormality appreciated.

3. Minimal peripheral right basilar atelectasis versus parenchymal scar.

## 2018-11-01 ENCOUNTER — Encounter: Admit: 2018-11-01 | Discharge: 2018-11-01 | Payer: Private health insurance—other commercial Indemnity

## 2018-11-01 MED ORDER — ONETOUCH VERIO MISC STRP
ORAL_STRIP | Freq: Two times a day (BID) | 0 refills
Start: 2018-11-01 — End: ?

## 2018-11-09 ENCOUNTER — Encounter: Admit: 2018-11-09 | Discharge: 2018-11-09

## 2018-11-09 MED ORDER — ONETOUCH VERIO MISC STRP
ORAL_STRIP | Freq: Two times a day (BID) | 0 refills
Start: 2018-11-09 — End: ?

## 2018-11-14 ENCOUNTER — Encounter: Admit: 2018-11-14 | Discharge: 2018-11-14

## 2018-11-14 MED ORDER — ONETOUCH VERIO MISC STRP
ORAL_STRIP | Freq: Two times a day (BID) | 0 refills
Start: 2018-11-14 — End: ?

## 2018-11-19 ENCOUNTER — Encounter: Admit: 2018-11-19 | Discharge: 2018-11-19

## 2018-11-19 MED ORDER — ONETOUCH VERIO MISC STRP
ORAL_STRIP | Freq: Two times a day (BID) | 0 refills
Start: 2018-11-19 — End: ?

## 2018-12-06 ENCOUNTER — Encounter: Admit: 2018-12-06 | Discharge: 2018-12-06

## 2018-12-06 ENCOUNTER — Ambulatory Visit: Admit: 2018-12-06 | Discharge: 2018-12-06 | Payer: Private Health Insurance - Indemnity

## 2018-12-06 DIAGNOSIS — E785 Hyperlipidemia, unspecified: Secondary | ICD-10-CM

## 2018-12-06 DIAGNOSIS — Z87891 Personal history of nicotine dependence: Secondary | ICD-10-CM

## 2018-12-06 DIAGNOSIS — E119 Type 2 diabetes mellitus without complications: Secondary | ICD-10-CM

## 2018-12-06 DIAGNOSIS — F419 Anxiety disorder, unspecified: Secondary | ICD-10-CM

## 2018-12-06 DIAGNOSIS — G4730 Sleep apnea, unspecified: Secondary | ICD-10-CM

## 2018-12-06 DIAGNOSIS — I10 Essential (primary) hypertension: Secondary | ICD-10-CM

## 2018-12-06 DIAGNOSIS — F329 Major depressive disorder, single episode, unspecified: Secondary | ICD-10-CM

## 2018-12-06 DIAGNOSIS — G4733 Obstructive sleep apnea (adult) (pediatric): Secondary | ICD-10-CM

## 2018-12-06 DIAGNOSIS — I2510 Atherosclerotic heart disease of native coronary artery without angina pectoris: Secondary | ICD-10-CM

## 2019-05-03 ENCOUNTER — Encounter: Admit: 2019-05-03 | Discharge: 2019-05-03

## 2019-05-14 ENCOUNTER — Encounter: Admit: 2019-05-14 | Discharge: 2019-05-14

## 2019-05-14 MED ORDER — HYDROCHLOROTHIAZIDE 25 MG PO TAB
ORAL_TABLET | Freq: Every day | ORAL | 3 refills | 28.00000 days | Status: DC
Start: 2019-05-14 — End: 2020-05-20

## 2019-05-27 ENCOUNTER — Encounter: Admit: 2019-05-27 | Discharge: 2019-05-27

## 2019-05-27 DIAGNOSIS — E119 Type 2 diabetes mellitus without complications: Secondary | ICD-10-CM

## 2019-05-27 MED ORDER — METFORMIN 500 MG PO TB24
ORAL_TABLET | Freq: Every day | 0 refills
Start: 2019-05-27 — End: ?

## 2019-05-30 ENCOUNTER — Encounter: Admit: 2019-05-30 | Discharge: 2019-05-30

## 2019-05-30 DIAGNOSIS — E119 Type 2 diabetes mellitus without complications: Secondary | ICD-10-CM

## 2019-05-30 MED ORDER — METFORMIN 500 MG PO TB24
ORAL_TABLET | Freq: Every day | 0 refills
Start: 2019-05-30 — End: ?

## 2019-06-06 NOTE — Progress Notes
Date of Service: 06/07/2019     Subjective:             Jeremy Bullock is a 58 y.o. male.    History of Present Illness  Pt presents to clinic for management of T2 DM.  He was last seen on 04/14/17    Interval changes:  He has no new complaints since last visit.  He continues to try to eat healthy and exercise.  A1C is 6.0 today, was previously 6.1 last visit.  Wt today is 289 lbs.  Was previously 302 lbs  Check fasting daily.I've reviewed meter download and BG patterns with pt- see printout in Careers information officer.     Fasting BGs range 85-127, average 110 (SD 11)  Current glucose is 137 random      T2 DM:   [id number] in 2017  Current treatment: metformin XR 500 mg and 1,000 at dinner  Medication adherent: all of the time   Past treatment: used lantus and novolog while inpt after CABG  SMBG and consistency: daily One touch verio  Hyperglycemia: when he eats more  Hypoglycemia: none on meter.  Felt symptoms when blood sugars in the 90 range especially when he took metformin and did not eat immediately.  Meals per day: 3.  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     Complications of DM:  CAD: yes, CABG in 2017  CVA: no  PVD: no  Amputations: no  Retinopathy: no  Gastropathy: no  Nephropathy: no, due for microalbumin over creatinine ratio 12.12 in 11/2016  Neuropathy: no    Last dental exam within 6 mo: Yes, late due to COVID  Last eye exam within 12 mo:  Yes, Fall 2019    Medications:  Statin: yes  ACE-I or ARB: yes  ASA: yes    Medical History:   Diagnosis Date   ??? Anxiety    ??? Coronary artery disease    ??? Depression    ??? DM (diabetes mellitus) (HCC)    ??? Dyslipidemia 05/22/2009   ??? Essential (primary) hypertension 05/22/2009   ??? History of tobacco use 05/22/2009   ??? Obstructive sleep apnea    ??? Sleep apnea 05/22/2009     Surgical History:   Procedure Laterality Date   ??? Left Heart Cath With Ventriculogram N/A 01/01/2016 Performed by Cath, Physician at [facility] CATH LAB   ??? Coronary Angiography N/A 01/01/2016    Performed by Cath, Physician at [facility] CATH LAB   ??? Possible Percutaneous Coronary Intervention N/A 01/01/2016    Performed by Cath, Physician at [facility] CATH LAB   ??? CORONARY ARTERY BYPASS GRAFT  01/04/16    three vessel   ??? BYPASS GRAFT CORONARY ARTERY X3,  EVH, LIMA N/A 01/04/2016    Performed by Hendricks Milo, MD at [facility] CVOR   ??? COLONOSCOPY N/A 01/08/2016    Performed by Everardo All, MD at [facility] ENDO   ??? HX HEART CATHETERIZATION       Family History   Problem Relation Age of Onset   ??? High Cholesterol Mother    ??? Cancer Mother    ??? Pulmonary HTN Mother    ??? Diabetes Father    ??? Coronary Artery Disease Father    ??? Heart Attack Father      Social History     Socioeconomic History   ???  Marital status: Married     Spouse name: Kennon Rounds   ??? Number of children: 3   ??? Years of education: 53   ??? Highest education level: Not on file   Occupational History   ??? Occupation: Maintenance Superintendent     Employer: [employer]   Tobacco Use   ??? Smoking status: Never Smoker   ??? Smokeless tobacco: Former Estate agent and Sexual Activity   ??? Alcohol use: Yes     Alcohol/week: 1.0 - 2.0 standard drinks     Types: 1 - 2 Cans of beer per week     Comment: occasionally   ??? Drug use: No   ??? Sexual activity: Yes     Partners: Female   Other Topics Concern   ??? Not on file   Social History Narrative   ??? Not on file       Labs:  Glucose, POC   Date/Time Value Ref Range Status   04/14/2017 215  Final     Cholesterol   Date/Time Value Ref Range Status   05/21/2018 95 (L) 150 - 200 Final     HDL   Date/Time Value Ref Range Status   05/21/2018 30 (L) 35 - 60 Final     LDL   Date/Time Value Ref Range Status   05/21/2018 55  Final     Triglycerides   Date/Time Value Ref Range Status   05/21/2018 80  Final     Non HDL Cholesterol   Date/Time Value Ref Range Status   11/25/2016 11:58 AM 51 MG/DL Final     Comment: Calculated non-HDL Cholesterol (non-HDL-C) indirectly measures LDL-C, Lp(a),   IDL-C, and VLDL-C.  It is a surrogate marker for Apoprotein B.  Goal should be   less than 130 mg/dL.       Potassium   Date/Time Value Ref Range Status   05/21/2018 3.9  Final     Blood Urea Nitrogen   Date/Time Value Ref Range Status   05/21/2018 17.0  Final     Creatinine   Date/Time Value Ref Range Status   05/21/2018 1.04  Final     AST (SGOT)   Date/Time Value Ref Range Status   11/25/2016 11:58 AM 17 7 - 40 U/L Final     ALT (SGPT)   Date/Time Value Ref Range Status   11/25/2016 11:58 AM 23 7 - 56 U/L Final     TSH   Date/Time Value Ref Range Status   11/25/2016 11:58 AM 1.173 0.35 - 5.00 MCU/ML Final     Vitamin D(25-OH)Total   Date/Time Value Ref Range Status   11/25/2016 11:58 AM 30.9 30 - 80 NG/ML Final            Review of Systems   Constitutional: Negative for activity change, appetite change, diaphoresis, fatigue and unexpected weight change.   Respiratory: Negative for chest tightness and shortness of breath.    Cardiovascular: Negative for chest pain, palpitations and leg swelling.   Gastrointestinal: Positive for constipation.   Endocrine: Positive for cold intolerance. Negative for heat intolerance, polydipsia and polyuria.   Genitourinary: Positive for difficulty urinating.   Musculoskeletal: Negative for back pain and gait problem.   Neurological: Negative for dizziness, light-headedness and headaches.   Psychiatric/Behavioral: Negative for confusion and sleep disturbance.   All other systems reviewed and are negative.        Objective:         ??? aspirin EC 81 mg tablet  Take 1 Tab by mouth daily. Take with food.   ??? atorvastatin (LIPITOR) 40 mg tablet Take one tablet by mouth daily.   ??? finasteride (PROSCAR) 5 mg tablet Take 5 mg by mouth daily.   ??? hydroCHLOROthiazide (HYDRODIURIL) 25 mg tablet Take 1 tablet by mouth once daily   ??? metFORMIN-XR(+) (GLUCOPHAGE XR) 500 mg extended release tablet TAKE 1 TABLET BY MOUTH IN THE MORNING WITH BREAKFAST AND 2 TABLETS IN THE EVENING WITH  DINNER   ??? ONE TOUCH DELICA 33 gauge MISC Use 1 Each as directed twice daily as needed. E11.65  Collaborating MD:  Dr. Henrine Screws   ??? ONETOUCH VERIO test strip USE ONE STRIP TO CHECK GLUCOSE TWICE DAILY BEFORE MEAL(S)   ??? ONETOUCH VERIO test strip USE ONE STRIP TO CHECK GLUCOSE TWICE DAILY BEFORE MEAL(S)   ??? sertraline (ZOLOFT) 50 mg tablet TAKE ONE TABLET BY MOUTH ONCE DAILY   ??? tamsulosin (FLOMAX) 0.4 mg capsule Take 1 capsule by mouth at bedtime daily.   ??? valsartan (DIOVAN) 160 mg tablet Take one tablet by mouth daily.   ??? verapamil  SR (CALAN-SR) 180 mg tablet Take one tablet by mouth daily.     Vitals:    06/07/19 0957   BP: 124/70   Pulse: 58   Weight: 131.4 kg (289 lb 9.6 oz)   Height: 185.4 cm (72.99)   PainSc: Zero     Body mass index is 38.22 kg/m???.     Physical Exam  Constitutional:       Appearance: He is well-developed.   HENT:      Head: Normocephalic and atraumatic.   Neck:      Musculoskeletal: Normal range of motion and neck supple.      Thyroid: No thyromegaly.   Cardiovascular:      Heart sounds: Normal heart sounds.   Pulmonary:      Effort: Pulmonary effort is normal.      Breath sounds: Normal breath sounds.   Abdominal:      Palpations: Abdomen is soft.      Comments: No lipohypertrophy noted in abd tissue   Skin:     General: Skin is warm and dry.   Neurological:      Mental Status: He is alert and oriented to person, place, and time.   Psychiatric:         Behavior: Behavior normal.         Diabetic Foot Exam       Bilateral vascular, sensation, integument are normal:  No    Vascular Status    Left:normal Right:normal   Monofilament Testing    Left:Diminished Right:Diminished   Sensation    Left:vibration sense diminished Right:vibration sense diminished   Skin Integrity    Left:Callous Right:Callous   Foot Structure    Left:Normal Right:Normal          Assessment and Plan: 1.T2 DM,  controlled . A1C is 6.0 today, was previously 6.1 last visit. Target less than 7.   Continue metformin XR 500 mg in pm 1000 mg in PM.  He is interested in stopping medication in the future but I think metformin is a great medication to help with metabolic syndrome so should be continued despite normal A1c.  Continue exercise and healthy eating    Check glucose weekly and may alternate between HS or fasting     2  Diet and life style modifications  Discussed patient's BMI with him.  The body mass index is  38.22 kg/m???. 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    3. Hypertension at goal less than 130/80.  4. Lipid Continue lipitor 40 mg reduced every other day from daily due to knee pain. Due for lipid.  5.  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      RTC in 12 mo

## 2019-06-07 ENCOUNTER — Encounter: Admit: 2019-06-07 | Discharge: 2019-06-07

## 2019-06-07 DIAGNOSIS — F329 Major depressive disorder, single episode, unspecified: Secondary | ICD-10-CM

## 2019-06-07 DIAGNOSIS — Z87891 Personal history of nicotine dependence: Secondary | ICD-10-CM

## 2019-06-07 DIAGNOSIS — F419 Anxiety disorder, unspecified: Secondary | ICD-10-CM

## 2019-06-07 DIAGNOSIS — I2510 Atherosclerotic heart disease of native coronary artery without angina pectoris: Secondary | ICD-10-CM

## 2019-06-07 DIAGNOSIS — G4730 Sleep apnea, unspecified: Secondary | ICD-10-CM

## 2019-06-07 DIAGNOSIS — I10 Essential (primary) hypertension: Secondary | ICD-10-CM

## 2019-06-07 DIAGNOSIS — E785 Hyperlipidemia, unspecified: Secondary | ICD-10-CM

## 2019-06-07 DIAGNOSIS — G4733 Obstructive sleep apnea (adult) (pediatric): Secondary | ICD-10-CM

## 2019-06-07 DIAGNOSIS — E119 Type 2 diabetes mellitus without complications: Secondary | ICD-10-CM

## 2019-06-07 MED ORDER — METFORMIN 500 MG PO TB24
ORAL_TABLET | 3 refills | Status: DC
Start: 2019-06-07 — End: 2020-01-31

## 2019-06-07 NOTE — Patient Instructions
A1C is 6.0 today, was previously 6.1 last visit.  Wt today is 289 lbs.   Current glucose is 137 random  No changes  metformin XR 500 mg and 1,000 at dinner

## 2019-06-07 NOTE — Progress Notes
PATIENT BROUGHT METER FOR DOWNLOAD.  PREPPED FOR FOOT EXAM

## 2019-06-08 ENCOUNTER — Ambulatory Visit: Admit: 2019-06-07 | Discharge: 2019-06-08

## 2019-06-08 DIAGNOSIS — E785 Hyperlipidemia, unspecified: Secondary | ICD-10-CM

## 2019-06-08 DIAGNOSIS — E119 Type 2 diabetes mellitus without complications: Secondary | ICD-10-CM

## 2019-06-08 DIAGNOSIS — I10 Essential (primary) hypertension: Secondary | ICD-10-CM

## 2019-06-11 ENCOUNTER — Encounter: Admit: 2019-06-11 | Discharge: 2019-06-11

## 2019-06-12 MED ORDER — VALSARTAN 160 MG PO TAB
ORAL_TABLET | Freq: Every day | ORAL | 1 refills | 60.00000 days | Status: DC
Start: 2019-06-12 — End: 2019-08-07

## 2019-06-26 ENCOUNTER — Encounter: Admit: 2019-06-26 | Discharge: 2019-06-26

## 2019-06-26 DIAGNOSIS — E785 Hyperlipidemia, unspecified: Secondary | ICD-10-CM

## 2019-06-26 DIAGNOSIS — E119 Type 2 diabetes mellitus without complications: Secondary | ICD-10-CM

## 2019-06-26 DIAGNOSIS — I10 Essential (primary) hypertension: Secondary | ICD-10-CM

## 2019-06-26 LAB — COMPREHENSIVE METABOLIC PANEL
Lab: 0.4 mg/dL (ref 0.2–1.2)
Lab: 1 mg/dL (ref 0.72–1.25)
Lab: 103 mmol/L (ref 98–107)
Lab: 108 mg/dL — ABNORMAL HIGH (ref 70–105)
Lab: 141 mmol/L (ref 136–145)
Lab: 15 U/L (ref 5–34)
Lab: 15 meq/L — ABNORMAL HIGH (ref 0–14)
Lab: 15 mg/dL (ref 8.4–25.7)
Lab: 21 U/L (ref 0–55)
Lab: 27 mmol/L (ref 22–29)
Lab: 3.9 mmol/L (ref 3.5–5.1)
Lab: 4.1 g/dL (ref 3.5–5.0)
Lab: 6.6 g/dL (ref 6.4–8.3)
Lab: 76 mL/min/{1.73_m2} (ref >59)
Lab: 82 U/L (ref 40–150)
Lab: 9.5 mg/dL (ref 8.4–10.2)

## 2019-06-26 LAB — MICROALB/CR RATIO-URINE RANDOM
Lab: 21 mg/L
Lab: 4.8 mg/g (ref <30.0)
Lab: 434 mg/dL — ABNORMAL HIGH (ref 63–166)

## 2019-06-26 LAB — LIPID PROFILE
Lab: 109 mg/dL (ref <200)
Lab: 33 mg/dL — ABNORMAL LOW (ref >40)
Lab: 59 mg/dL (ref <100)
Lab: 83 mg/dL (ref <150)

## 2019-08-06 MED ORDER — VALSARTAN 160 MG PO TAB
ORAL_TABLET | Freq: Every day | ORAL | 0 refills | 60.00000 days | Status: DC
Start: 2019-08-06 — End: 2019-12-06

## 2019-12-05 ENCOUNTER — Encounter: Admit: 2019-12-05 | Discharge: 2019-12-05 | Payer: Private Health Insurance - Indemnity

## 2019-12-06 ENCOUNTER — Encounter: Admit: 2019-12-06 | Discharge: 2019-12-06 | Payer: Private Health Insurance - Indemnity

## 2019-12-06 MED ORDER — VALSARTAN 160 MG PO TAB
160 mg | ORAL_TABLET | Freq: Every day | ORAL | 3 refills | 60.00000 days | Status: DC
Start: 2019-12-06 — End: 2020-04-09

## 2019-12-06 MED ORDER — VALSARTAN 160 MG PO TAB
ORAL_TABLET | Freq: Every day | 0 refills
Start: 2019-12-06 — End: ?

## 2020-01-31 ENCOUNTER — Ambulatory Visit: Admit: 2020-01-31 | Discharge: 2020-02-01 | Payer: Private Health Insurance - Indemnity

## 2020-01-31 ENCOUNTER — Encounter: Admit: 2020-01-31 | Discharge: 2020-01-31 | Payer: Private Health Insurance - Indemnity

## 2020-01-31 DIAGNOSIS — E119 Type 2 diabetes mellitus without complications: Secondary | ICD-10-CM

## 2020-01-31 DIAGNOSIS — G4730 Sleep apnea, unspecified: Secondary | ICD-10-CM

## 2020-01-31 DIAGNOSIS — E785 Hyperlipidemia, unspecified: Secondary | ICD-10-CM

## 2020-01-31 DIAGNOSIS — I10 Essential (primary) hypertension: Secondary | ICD-10-CM

## 2020-01-31 DIAGNOSIS — I2510 Atherosclerotic heart disease of native coronary artery without angina pectoris: Secondary | ICD-10-CM

## 2020-01-31 DIAGNOSIS — G4733 Obstructive sleep apnea (adult) (pediatric): Secondary | ICD-10-CM

## 2020-01-31 DIAGNOSIS — Z87891 Personal history of nicotine dependence: Secondary | ICD-10-CM

## 2020-01-31 DIAGNOSIS — F419 Anxiety disorder, unspecified: Secondary | ICD-10-CM

## 2020-01-31 DIAGNOSIS — F329 Major depressive disorder, single episode, unspecified: Secondary | ICD-10-CM

## 2020-01-31 MED ORDER — ONETOUCH VERIO TEST STRIPS MISC STRP
1 | ORAL_STRIP | Freq: Two times a day (BID) | 3 refills | Status: AC
Start: 2020-01-31 — End: ?

## 2020-01-31 MED ORDER — SEMAGLUTIDE 0.25 MG OR 0.5 MG(2 MG/1.5 ML) SC PNIJ
.25 mg | SUBCUTANEOUS | 0 refills | Status: DC
Start: 2020-01-31 — End: 2020-05-20

## 2020-01-31 MED ORDER — METFORMIN 500 MG PO TB24
ORAL_TABLET | 3 refills | Status: AC
Start: 2020-01-31 — End: ?

## 2020-01-31 MED ORDER — LANCETS 33 GAUGE MISC MISC
1 | Freq: Two times a day (BID) | 3 refills | Status: AC | PRN
Start: 2020-01-31 — End: ?

## 2020-01-31 NOTE — Patient Instructions
Goals we discussed today:     When starting Ozempic, use 0.25 mg weekly   As we discussed, this medication may keep food in your stomach longer.  To avoid nausea, eat slowly and stop eating when no longer hungry. Use smaller portion sizes and avoid overeating.      Walking after meals may help resolve any stomach problems.   If you tolerate the ozempic low dose for 4 weeks, you may advance to 0.5 mg on week 5.  At that point, you may send a mychart message or call to request a refill at that dose or if tolerating well, then the full dose of 1 mg weekly would be beneficial for wt loss and glucose lowering.        Please contact [facility] for any questions or abnormal glucose values (above 300 or less than 70 repeatedly).  [phone].    My nurse can be reached at [phone].   Call us to make your next appointment, the scheduler can be reached at [phone].       Please get your COVID-19 vaccine when you are eligible, as you are at high risk of developing complications.   Visit [email or web address] for the latest news.    In [place]:  [email or web address] in [place].    In [place]:   [email or web address]  Register in your county's health department where you live and where you work, as they likely have the quickest availability.      Please get the vaccine as soon as possible for a healthy 2021.      Foot care:  Take good care of your feet.  Never cut your nails close to the tips of the toes. Promptly call your doctor if you have an infection, ulcer or cut anywhere on your feet. Wear shoes with a wide toe box and that fit comfortably.  Avoid walking barefoot.     Your recent lab values:    Hemoglobin A1C   Date Value Ref Range Status   12/31/2015 7.5 (H) 4.0 - 6.0 % Final     Comment:     The ADA recommends that most patients with type 1 and type 2 diabetes maintain   an A1c level <7%.     02/25/2015 6.6 (H) 4.5 - 6.2 Final   07/12/2014 7.4 (H) 4.5 - 6.2 Final     Poc Hemoglobin A1C   Date Value Ref Range Status   01/31/2020 6.4 (A) 4 - 6 % Final   06/07/2019 6.1 (H) 4 - 6 % Final   04/14/2017 6.1  Final       To schedule Diabetes Education please call [phone], Option 3   We also offer free classes over ZOOM, and you may ask about those options when you call.      [email or web address]  is our website for cooking tips and classes.      If you have trouble affording your medications, please contact [facility]: Merry Proud and Melissa at [email or web address] or [phone].      Thanks for our visit today, we look forward to seeing you again.

## 2020-01-31 NOTE — Progress Notes
Date of Service: 01/31/2020     Subjective:             Jeremy Bullock is a 59 y.o. male.    Diabetes      Pt presents to clinic for management of T2 DM.  He was last seen in 2020 with Dr Lajoyce Corners    Interval changes:  He has no new complaints since last visit.  He continues to try to eat healthy and exercise.  Has gained wt and has some trouble sleeping.  Would like to be able to lose wt.   A1C is 6.4 today, was previously 6.0 last visit in 2020  Wt today is 303, up from 289 lbs in 2020.    Check fasting daily.I've reviewed meter download and BG patterns with pt-     Fasting BGs range 109-150, average 125  Current glucose is 169 random      T2 DM:   [id number] in 2017  Current treatment: metformin XR 500 mg and 1,000 at dinner with ongoing diarrhea every few days  Medication adherent: all of the time   Past treatment: used lantus and novolog while inpt after CABG  SMBG and consistency: daily One touch verio  Hyperglycemia: when he eats more  Hypoglycemia: none on meter.  Felt symptoms when blood sugars in the 90 range especially when he took metformin and did not eat immediately.  Meals per day: 3.  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     Complications of DM:  CAD: yes, CABG in 2017  CVA: no  PVD: no  Amputations: no  Retinopathy: no  Gastropathy: no  Nephropathy: no, due for microalbumin over creatinine ratio 12.12 in 11/2016  Neuropathy: no    Is wanting COVID vaccine, has not had it yet    Medications:  Statin: yes  ACE-I or ARB: yes  ASA: yes    Medical History:   Diagnosis Date   ? Anxiety    ? Coronary artery disease    ? Depression    ? DM (diabetes mellitus) (HCC)    ? Dyslipidemia 05/22/2009   ? Essential (primary) hypertension 05/22/2009   ? History of tobacco use 05/22/2009   ? Obstructive sleep apnea    ? Sleep apnea 05/22/2009     Surgical History:   Procedure Laterality Date   ? Left Heart Cath With Ventriculogram N/A 01/01/2016    Performed by Cath, Physician at [facility] CATH LAB   ? Coronary Angiography N/A 01/01/2016    Performed by Cath, Physician at [facility] CATH LAB   ? Possible Percutaneous Coronary Intervention N/A 01/01/2016    Performed by Cath, Physician at [facility] CATH LAB   ? CORONARY ARTERY BYPASS GRAFT  01/04/16    three vessel   ? BYPASS GRAFT CORONARY ARTERY X3,  EVH, LIMA N/A 01/04/2016    Performed by Hendricks Milo, MD at [facility] CVOR   ? COLONOSCOPY N/A 01/08/2016    Performed by Everardo All, MD at [facility] ENDO   ? HX HEART CATHETERIZATION       Family History   Problem Relation Age of Onset   ? High Cholesterol Mother    ? Cancer Mother    ? Pulmonary HTN Mother    ? Diabetes Father    ? Coronary Artery Disease Father    ? Heart Attack Father  Social History     Socioeconomic History   ? Marital status: Married     Spouse name: Kennon Rounds   ? Number of children: 3   ? Years of education: 19   ? Highest education level: Not on file   Occupational History   ? Occupation: Maintenance Superintendent     Employer: [employer]   Tobacco Use   ? Smoking status: Never Smoker   ? Smokeless tobacco: Former Estate agent and Sexual Activity   ? Alcohol use: Yes     Alcohol/week: 1.0 - 2.0 standard drinks     Types: 1 - 2 Cans of beer per week     Comment: occasionally   ? Drug use: No   ? Sexual activity: Yes     Partners: Female   Other Topics Concern   ? Not on file   Social History Narrative   ? Not on file       Labs:  Glucose, POC   Date/Time Value Ref Range Status   01/31/2020 02:32 PM 169  Final     Cholesterol   Date/Time Value Ref Range Status   06/18/2019 07:42 AM 109 <200 mg/dL Final     HDL   Date/Time Value Ref Range Status   06/18/2019 07:42 AM 33 (L) >40 mg/dL Final     LDL   Date/Time Value Ref Range Status   06/18/2019 07:42 AM 59 <100 mg/dL Final     Triglycerides   Date/Time Value Ref Range Status   06/18/2019 07:42 AM 83 <150 mg/dL Final     Non HDL Cholesterol   Date/Time Value Ref Range Status 11/25/2016 11:58 AM 51 MG/DL Final     Comment:     Calculated non-HDL Cholesterol (non-HDL-C) indirectly measures LDL-C, Lp(a),   IDL-C, and VLDL-C.  It is a surrogate marker for Apoprotein B.  Goal should be   less than 130 mg/dL.       Potassium   Date/Time Value Ref Range Status   06/18/2019 07:42 AM 3.9 3.5 - 5.1 mmol/L Final     Blood Urea Nitrogen   Date/Time Value Ref Range Status   06/18/2019 07:42 AM 15.0 8.4 - 25.7 mg/dL Final     Creatinine   Date/Time Value Ref Range Status   06/18/2019 07:42 AM 1.06 0.72 - 1.25 mg/dL Final     AST (SGOT)   Date/Time Value Ref Range Status   06/18/2019 07:42 AM 15 5 - 34 U/L Final     ALT (SGPT)   Date/Time Value Ref Range Status   06/18/2019 07:42 AM 21 0 - 55 U/L Final     TSH   Date/Time Value Ref Range Status   11/25/2016 11:58 AM 1.173 0.35 - 5.00 MCU/ML Final     Vitamin D(25-OH)Total   Date/Time Value Ref Range Status   11/25/2016 11:58 AM 30.9 30 - 80 NG/ML Final            Review of Systems   Gastrointestinal: Positive for diarrhea.   Psychiatric/Behavioral: Positive for sleep disturbance.   All other systems reviewed and are negative.        Objective:         ? aspirin EC 81 mg tablet Take 1 Tab by mouth daily. Take with food.   ? atorvastatin (LIPITOR) 40 mg tablet Take one tablet by mouth daily.   ? finasteride (PROSCAR) 5 mg tablet Take 5 mg by mouth daily.   ? hydroCHLOROthiazide (HYDRODIURIL) 25  mg tablet Take 1 tablet by mouth once daily   ? metFORMIN-XR (GLUCOPHAGE XR) 500 mg extended release tablet TAKE 1 TABLET BY MOUTH IN THE MORNING WITH BREAKFAST AND 2 TABLETS IN THE EVENING WITH  DINNER   ? ONE TOUCH DELICA 33 gauge MISC Use 1 Each as directed twice daily as needed. E11.65  Collaborating MD:  Dr. Henrine Screws   ? ONETOUCH VERIO test strip USE ONE STRIP TO CHECK GLUCOSE TWICE DAILY BEFORE MEAL(S)   ? ONETOUCH VERIO test strip USE ONE STRIP TO CHECK GLUCOSE TWICE DAILY BEFORE MEAL(S)   ? sertraline (ZOLOFT) 50 mg tablet TAKE ONE TABLET BY MOUTH ONCE DAILY   ? tamsulosin (FLOMAX) 0.4 mg capsule Take 1 capsule by mouth at bedtime daily.   ? valsartan (DIOVAN) 160 mg tablet Take one tablet by mouth daily.   ? verapamil  SR (CALAN-SR) 180 mg tablet Take one tablet by mouth daily.     Vitals:    01/31/20 1416   BP: 121/56   BP Source: Arm, Left Upper   Patient Position: Sitting   Pulse: 55   Temp: 36.7 ?C (98.1 ?F)   Weight: (!) 137.4 kg (303 lb)   Height: 185.4 cm (73)   PainSc: Zero     Body mass index is 39.98 kg/m?Marland Kitchen     Physical Exam  Constitutional:       Appearance: He is well-developed. He is obese.   HENT:      Head: Normocephalic and atraumatic.   Neck:      Musculoskeletal: Normal range of motion.      Thyroid: No thyromegaly.   Pulmonary:      Effort: Pulmonary effort is normal.   Skin:     General: Skin is warm and dry.   Neurological:      Mental Status: He is alert and oriented to person, place, and time.   Psychiatric:         Behavior: Behavior normal.          Assessment and Plan:  1.T2 DM,  controlled . A1C is 6.4 today, Target less than 7.   Continue metformin XR 500 mg in pm 1000 mg in PM.    He is interested in stopping medication due to diarrhea in the future but I think metformin is a great medication to help with metabolic syndrome so should be continued despite normal A1c.  With his higher prandial glucose and wt gain, will also start ozempic 0.25 mg weekly, advance as tolerated q 4 weeks.  Advised smaller portion sizes  Continue exercise and healthy eating    Check glucose weekly and may alternate between HS or fasting     2  Diet and life style modifications  Discussed patient's BMI with him.  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  Start GLP-1    3. Hypertension at goal less than 130/80.  4. Lipid Continue lipitor 40 mg reduced every other day from daily due to knee pain.   5.  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      RTC in 3 mo    Total time:  30 min with 15 min counseling about new med             Orders Placed This Encounter   ? GLUCOSE METER DOWNLOAD   ? PLATELET COUNT today   ?  COMPREHENSIVE METABOLIC PANEL today   ? LIPID PROFILE today   ? TSH today   ? MICROALB/CR RATIO-URINE RANDOM today   ? HEMOGLOBIN A1C   ? POC GLUCOSE QUANTITATIVE BLOOD   ? POC HEMOGLOBIN A1C   ? semaglutide (OZEMPIC) 0.25 mg or 0.5 mg(2 mg/1.5 mL) injection PEN   ? metFORMIN-XR (GLUCOPHAGE XR) 500 mg extended release tablet   ? blood sugar diagnostic (ONETOUCH VERIO TEST STRIPS) test strip   ? lancets 33 gauge (ONETOUCH DELICA 33 GUAGE) 33 gauge

## 2020-03-30 ENCOUNTER — Encounter: Admit: 2020-03-30 | Discharge: 2020-03-30 | Payer: Private Health Insurance - Indemnity

## 2020-03-30 MED ORDER — VALSARTAN 160 MG PO TAB
ORAL_TABLET | Freq: Every day | 0 refills
Start: 2020-03-30 — End: ?

## 2020-04-07 ENCOUNTER — Encounter: Admit: 2020-04-07 | Discharge: 2020-04-07 | Payer: Private Health Insurance - Indemnity

## 2020-04-07 MED ORDER — VALSARTAN 160 MG PO TAB
ORAL_TABLET | Freq: Every day | 0 refills
Start: 2020-04-07 — End: ?

## 2020-04-09 ENCOUNTER — Encounter: Admit: 2020-04-09 | Discharge: 2020-04-09 | Payer: Private Health Insurance - Indemnity

## 2020-04-09 MED ORDER — VALSARTAN 160 MG PO TAB
160 mg | ORAL_TABLET | Freq: Every day | ORAL | 2 refills | 60.00000 days | Status: AC
Start: 2020-04-09 — End: ?

## 2020-04-09 NOTE — Telephone Encounter
Received a call from patient asking to send script to their local pharmacy.  Valsartan e-scribed as requested. Left voicemail asking if other medications were also needed.  Colvin Caroli, LPN

## 2020-04-10 ENCOUNTER — Encounter: Admit: 2020-04-10 | Discharge: 2020-04-10 | Payer: Private Health Insurance - Indemnity

## 2020-04-10 NOTE — Telephone Encounter
Pt LM on nurse line needing scripts. Called patient back and he is unsure on which medications. Advised patient to have pharmacy send [place] a refill request. He is agreeable

## 2020-05-20 ENCOUNTER — Encounter: Admit: 2020-05-20 | Discharge: 2020-05-20 | Payer: Private Health Insurance - Indemnity

## 2020-05-20 MED ORDER — OZEMPIC 0.25 MG OR 0.5 MG(2 MG/1.5 ML) SC PNIJ
0 refills | Status: AC
Start: 2020-05-20 — End: ?

## 2020-05-20 MED ORDER — HYDROCHLOROTHIAZIDE 25 MG PO TAB
ORAL_TABLET | Freq: Every day | ORAL | 1 refills | 28.00000 days | Status: AC
Start: 2020-05-20 — End: ?

## 2020-05-22 ENCOUNTER — Encounter: Admit: 2020-05-22 | Discharge: 2020-05-22 | Payer: Private Health Insurance - Indemnity

## 2020-06-24 ENCOUNTER — Encounter: Admit: 2020-06-24 | Discharge: 2020-06-24 | Payer: Private Health Insurance - Indemnity

## 2020-06-24 MED ORDER — ATORVASTATIN 40 MG PO TAB
ORAL_TABLET | Freq: Every day | 3 refills | Status: AC
Start: 2020-06-24 — End: ?

## 2020-07-06 ENCOUNTER — Encounter: Admit: 2020-07-06 | Discharge: 2020-07-06 | Payer: Private Health Insurance - Indemnity

## 2020-07-06 MED ORDER — VALSARTAN 160 MG PO TAB
ORAL_TABLET | Freq: Every day | 0 refills
Start: 2020-07-06 — End: ?

## 2020-07-09 ENCOUNTER — Encounter: Admit: 2020-07-09 | Discharge: 2020-07-09 | Payer: Private Health Insurance - Indemnity

## 2020-07-09 DIAGNOSIS — E7800 Pure hypercholesterolemia, unspecified: Secondary | ICD-10-CM

## 2020-07-09 DIAGNOSIS — I10 Essential (primary) hypertension: Secondary | ICD-10-CM

## 2020-07-09 DIAGNOSIS — G4733 Obstructive sleep apnea (adult) (pediatric): Secondary | ICD-10-CM

## 2020-07-09 DIAGNOSIS — I2510 Atherosclerotic heart disease of native coronary artery without angina pectoris: Secondary | ICD-10-CM

## 2020-07-09 DIAGNOSIS — G4730 Sleep apnea, unspecified: Secondary | ICD-10-CM

## 2020-07-09 DIAGNOSIS — F329 Major depressive disorder, single episode, unspecified: Secondary | ICD-10-CM

## 2020-07-09 DIAGNOSIS — E119 Type 2 diabetes mellitus without complications: Secondary | ICD-10-CM

## 2020-07-09 DIAGNOSIS — Z87891 Personal history of nicotine dependence: Secondary | ICD-10-CM

## 2020-07-09 DIAGNOSIS — F419 Anxiety disorder, unspecified: Secondary | ICD-10-CM

## 2020-07-09 DIAGNOSIS — E785 Hyperlipidemia, unspecified: Secondary | ICD-10-CM

## 2020-07-09 NOTE — Progress Notes
Date of Service: 07/09/2020    Jeremy Bullock is a 59 y.o. male.       HPI     Jeremy Bullock?is followed for coronary artery disease, hypertension and hypercholesterolemia.  He is also followed for type 2 diabetes mellitus. When I saw him in January 2020 when he was having abdominal discomfort.  This has been attributed to diverticulitis and has improved significantly with changes in his diet.  Previously he was eating foods which contain a lot of seeds.  Otherwise, over the past?6 months,?the patient has been doing well and reports no angina, congestive symptoms, palpitations, sensation of sustained forceful heart pounding, lightheadedness or syncope.??His exercise tolerance has been stable, although he does not have a regular exercise routine.??  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  He states that he usually gets 8000 steps per day.?The patient reports no claudication, myalgias, bleeding abnormalities, neurologic motor abnormalities or difficulty with speech.??  Historically, I saw Jeremy Bullock back on 12/31/2015. ?At that time he reported exertional chest discomfort suggestive for angina pectoris. ?Cornary angiography was performed on 01/01/2016 and revealed severe three-vessel coronary disease. ?On 01/04/2016, he then underwent coronary bypass grafting times 3 with left internal mammary artery to LAD, reverse saphenous vein graft to OM1, and reverse saphenous vein graft to D2.?       Vitals:    07/09/20 1442   BP: 126/78   BP Source: Arm, Left Upper   Patient Position: Sitting   Pulse: 64   SpO2: 98%   Weight: (!) 138.4 kg (305 lb 3.2 oz)   Height: 1.854 m (6' 1)   PainSc: Zero     Body mass index is 40.27 kg/m?Marland Kitchen     Past Medical History  Patient Active Problem List    Diagnosis Date Noted   ? Abnormal chest x-ray 07/13/2017   ? Coronary artery disease due to calcified coronary lesion 02/11/2016     01/2016 - CABG     ? Type 2 diabetes mellitus (HCC) 02/19/2015     07/2014 - Fasting glucose 162. Hgb A1c 7.4%       ? Morbid obesity (HCC) 08/02/2011   ? Essential (primary) hypertension 05/22/2009   ? History of tobacco use 05/22/2009   ? Dyslipidemia 05/22/2009     borderline     ? Sleep apnea 05/22/2009     2004 - Diagnosis by sleep study at [facility], ordered by Dr. Gwenette Greet.  CPAP started.    09/2012 - Office consultation with Dr. Lucilla Lame.  Follow-up sleep study scheduled for 10/2012.           Review of Systems   Constitutional: Negative.   HENT: Negative.    Eyes: Negative.    Cardiovascular: Negative.    Respiratory: Negative.    Endocrine: Negative.    Hematologic/Lymphatic: Negative.    Skin: Negative.    Musculoskeletal: Negative.    Gastrointestinal: Negative.    Genitourinary: Negative.    Neurological: Negative.    Psychiatric/Behavioral: Negative.    Allergic/Immunologic: Negative.        Physical Exam  GENERAL: The patient is well developed,?overweight,?well nourished, resting comfortably and in no distress.   HEENT: No abnormalities of the visible oro-nasopharynx, conjunctiva or sclera are noted.  NECK: There is no jugular venous distension. Carotids are palpable and without bruits. There is no thyroid enlargement.  Chest: Lung fields are clear to auscultation. There are no wheezes or crackles.  CV: There is  a regular rhythm. The first and second heart sounds are normal. There are no murmurs, gallops or rubs.??His apical heart rate is 60 bpm.  ABD: The abdomen is soft and supple with normal bowel sounds. There is no hepatosplenomegaly, ascites, tenderness, masses or bruits.  Neuro: There are no focal motor defects. Ambulation is normal. Cognitive function appears normal.  Ext:?There is no edema or evidence of deep vein thrombosis. Peripheral pulses are satisfactory. ?  SKIN:?There are no rashes and no cellulitis  PSYCH:?The patient is calm, rationale and oriented    Cardiovascular Studies  A twelve-lead ECG was obtained on 07/09/2020 reveals normal sinus rhythm with a heart rate of 58 bpm.  An atrial premature beat is noted.  Nondiagnostic ST-T wave changes are present have been noted previously.  Labs from 01/31/2020 revealed hemoglobin A1c=6.4%.  On 06/18/2019 his potassium was 3.9 mmol/L and serum creatinine 1.06 mg/dL.  His total cholesterol was 109, triglycerides 83, HDL 33 and LDL cholesterol 59 mg/dL.  His ALT = 21.    Problems Addressed Today  Coronary artery disease.  Diabetes mellitus.  Hypertension.  Hypercholesterolemia.  Assessment and Plan     Jeremy Bullock is doing well from a cardiovascular perspective and reports no angina or congestive symptoms.  There is no evidence of congestive heart failure on examination.  His blood pressure is well controlled and his LDL cholesterol appears well controlled. Regular mild aerobic exercise, weight loss and adherence to a heart healthy diabetic diet were recommended.  Otherwise, I have asked Jeremy Bullock to return for follow-up in approximately 12 months time         Current Medications (including today's revisions)  ? aspirin EC 81 mg tablet Take 1 Tab by mouth daily. Take with food.   ? atorvastatin (LIPITOR) 40 mg tablet Take 1 tablet by mouth once daily   ? blood sugar diagnostic (ONETOUCH VERIO TEST STRIPS) test strip Use one strip as directed twice daily before meals.   ? finasteride (PROSCAR) 5 mg tablet Take 5 mg by mouth daily.   ? hydroCHLOROthiazide (HYDRODIURIL) 25 mg tablet Take 1 tablet by mouth once daily   ? lancets 33 gauge (ONETOUCH DELICA 33 GUAGE) 33 gauge Use 1 each as directed twice daily as needed. E11.65  Collaborating MD:  Dr. Henrine Screws   ? metFORMIN-XR (GLUCOPHAGE XR) 500 mg extended release tablet TAKE 1 TABLET BY MOUTH IN THE MORNING WITH BREAKFAST AND 2 TABLETS IN THE EVENING WITH  DINNER   ? OZEMPIC 0.25 mg or 0.5 mg(2 mg/1.5 mL) injection PEN INJECT 0.25 MG SUBCUTANEOUSLY EVERY 7 DAYS IF TOLERATING MAY INCREASE TO 0.5MG  WEEKLY DOSE ON WEEK 5   ? sertraline (ZOLOFT) 50 mg tablet TAKE ONE TABLET BY MOUTH ONCE DAILY   ? tamsulosin (FLOMAX) 0.4 mg capsule Take 1 capsule by mouth at bedtime daily.   ? valsartan (DIOVAN) 160 mg tablet Take 1 tablet by mouth once daily   ? verapamil  SR (CALAN-SR) 180 mg tablet Take one tablet by mouth daily.

## 2020-07-14 ENCOUNTER — Encounter: Admit: 2020-07-14 | Discharge: 2020-07-14 | Payer: Private Health Insurance - Indemnity

## 2020-07-24 ENCOUNTER — Encounter: Admit: 2020-07-24 | Discharge: 2020-07-24 | Payer: Private Health Insurance - Indemnity

## 2020-07-24 ENCOUNTER — Ambulatory Visit: Admit: 2020-07-24 | Discharge: 2020-07-25 | Payer: Private Health Insurance - Indemnity

## 2020-07-24 DIAGNOSIS — E119 Type 2 diabetes mellitus without complications: Secondary | ICD-10-CM

## 2020-07-24 DIAGNOSIS — F329 Major depressive disorder, single episode, unspecified: Secondary | ICD-10-CM

## 2020-07-24 DIAGNOSIS — Z87891 Personal history of nicotine dependence: Secondary | ICD-10-CM

## 2020-07-24 DIAGNOSIS — F419 Anxiety disorder, unspecified: Secondary | ICD-10-CM

## 2020-07-24 DIAGNOSIS — G4730 Sleep apnea, unspecified: Secondary | ICD-10-CM

## 2020-07-24 DIAGNOSIS — I2510 Atherosclerotic heart disease of native coronary artery without angina pectoris: Secondary | ICD-10-CM

## 2020-07-24 DIAGNOSIS — G4733 Obstructive sleep apnea (adult) (pediatric): Secondary | ICD-10-CM

## 2020-07-24 DIAGNOSIS — E785 Hyperlipidemia, unspecified: Secondary | ICD-10-CM

## 2020-07-24 DIAGNOSIS — I10 Essential (primary) hypertension: Secondary | ICD-10-CM

## 2020-07-24 MED ORDER — OZEMPIC 1 MG/DOSE (4 MG/3 ML) SC PNIJ
1 mg | SUBCUTANEOUS | 3 refills | Status: AC
Start: 2020-07-24 — End: ?

## 2020-07-24 MED ORDER — METFORMIN 500 MG PO TB24
500 mg | ORAL_TABLET | Freq: Two times a day (BID) | ORAL | 3 refills | Status: AC
Start: 2020-07-24 — End: ?

## 2020-07-24 NOTE — Patient Instructions
Please get your COVID-19 vaccine, as you are at high risk of developing complications.   Visit [email or web address] for daily [place] updates from [place] infectious disease and pulmonary chief medical director.    To get a vaccine at [facility] by calling [phone] to schedule.    If you prefer to go to a site in [place]:   [email or web address]   Please follow your doctor's and APRN's advice and get the vaccine as soon as possible to survive this pandemic.  Swim with the sharks or climb into the lifeboat!      Goals we discussed today:       Please contact [facility] for any questions or abnormal glucose values (above 300 or less than 70 repeatedly).  [phone].    My nurse can be reached at [phone].   Call us to make your next appointment, the scheduler can be reached at [phone].         Foot care:  Take good care of your feet.  Never cut your nails close to the tips of the toes. Promptly call your doctor if you have an infection, ulcer or cut anywhere on your feet. Wear shoes with a wide toe box and that fit comfortably.  Avoid walking barefoot.     Your recent lab values:    Hemoglobin A1C   Date Value Ref Range Status   12/31/2015 7.5 (H) 4.0 - 6.0 % Final     Comment:     The ADA recommends that most patients with type 1 and type 2 diabetes maintain   an A1c level <7%.     02/25/2015 6.6 (H) 4.5 - 6.2 Final   07/12/2014 7.4 (H) 4.5 - 6.2 Final     Poc Hemoglobin A1C   Date Value Ref Range Status   01/31/2020 6.4 (A) 4 - 6 % Final   06/07/2019 6.1 (H) 4 - 6 % Final   04/14/2017 6.1  Final       To schedule Diabetes Education please call [phone], Option 3   We also offer free classes over ZOOM, and you may ask about those options when you call.      [email or web address]  is our website for cooking tips and demos, and classes.      If you have trouble affording your medications, please contact [place] [facility]: Merry Proud and Melissa at [email or web address] or [phone].      Thanks for our visit today, we look forward to seeing you again.

## 2020-07-24 NOTE — Progress Notes
Date of Service: 07/24/2020     Subjective:             Jeremy Bullock is a 59 y.o. male.    Diabetes      Pt presents to clinic for management of T2 DM.  He was last seen 6 mo ago with me and in 2020 with Dr Lajoyce Corners    Interval changes:  He has lost some wt since last visit, but then regained some. Tolerating ozempic 0.5 and has diarrhea if taking more than 1g of metformin xr.  He continues to try to eat healthy and exercise.  Would like to be able to lose wt.   A1C is 5.8 today, was previously 6.4 in 3/21, 6.0 last visit in 2020  Wt today is 297, was 303 in 3/21, up from 289 lbs in 2020.      T2 DM:   [id number] in 2017  Current treatment: metformin XR 500 mg BID at meals, ozempic 0.5 mg weekly  Medication adherent: all of the time   Past treatment: used lantus and novolog while inpt after CABG  SMBG and consistency: daily One touch verio  Hyperglycemia: when he eats more  Hypoglycemia: none.  Felt symptoms when blood sugars in the 90 range especially when he took metformin and did not eat immediately.  Meals per day: 3.  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     Complications of DM:  CAD: yes, CABG in 2017  CVA: no  PVD: no  Amputations: no  Retinopathy: no  Gastropathy: no  Nephropathy: no, due for microalbumin over creatinine ratio 12.12 in 11/2016  Neuropathy: no    Had COVID vaccines    Medications:  Statin: yes  ACE-I or ARB: yes  ASA: yes    Medical History:   Diagnosis Date   ? Anxiety    ? Coronary artery disease    ? Depression    ? DM (diabetes mellitus) (HCC)    ? Dyslipidemia 05/22/2009   ? Essential (primary) hypertension 05/22/2009   ? History of tobacco use 05/22/2009   ? Obstructive sleep apnea    ? Sleep apnea 05/22/2009     Surgical History:   Procedure Laterality Date   ? Left Heart Cath With Ventriculogram N/A 01/01/2016    Performed by Cath, Physician at [facility] CATH LAB   ? Coronary Angiography N/A 01/01/2016    Performed by Cath, Physician at [facility] CATH LAB   ? Possible Percutaneous Coronary Intervention N/A 01/01/2016    Performed by Cath, Physician at [facility] CATH LAB   ? CORONARY ARTERY BYPASS GRAFT  01/04/16    three vessel   ? BYPASS GRAFT CORONARY ARTERY X3,  EVH, LIMA N/A 01/04/2016    Performed by Hendricks Milo, MD at [facility] CVOR   ? COLONOSCOPY N/A 01/08/2016    Performed by Everardo All, MD at [facility] ENDO   ? HX HEART CATHETERIZATION       Family History   Problem Relation Age of Onset   ? High Cholesterol Mother    ? Cancer Mother    ? Pulmonary HTN Mother    ? Diabetes Father    ? Coronary Artery Disease Father    ? Heart Attack Father      Social History     Socioeconomic History   ? Marital status: Married  Spouse name: Kennon Rounds   ? Number of children: 3   ? Years of education: 71   ? Highest education level: Not on file   Occupational History   ? Occupation: Maintenance Superintendent     Employer: [employer]   Tobacco Use   ? Smoking status: Never Smoker   ? Smokeless tobacco: Former Estate agent and Sexual Activity   ? Alcohol use: Yes     Alcohol/week: 1.0 - 2.0 standard drinks     Types: 1 - 2 Cans of beer per week     Comment: occasionally   ? Drug use: No   ? Sexual activity: Yes     Partners: Female   Other Topics Concern   ? Not on file   Social History Narrative   ? Not on file       Labs:  Glucose, POC   Date/Time Value Ref Range Status   01/31/2020 02:32 PM 169  Final     Cholesterol   Date/Time Value Ref Range Status   06/18/2019 07:42 AM 109 <200 mg/dL Final     HDL   Date/Time Value Ref Range Status   06/18/2019 07:42 AM 33 (L) >40 mg/dL Final     LDL   Date/Time Value Ref Range Status   06/18/2019 07:42 AM 59 <100 mg/dL Final     Triglycerides   Date/Time Value Ref Range Status   06/18/2019 07:42 AM 83 <150 mg/dL Final     Non HDL Cholesterol   Date/Time Value Ref Range Status   11/25/2016 11:58 AM 51 MG/DL Final     Comment:     Calculated non-HDL Cholesterol (non-HDL-C) indirectly measures LDL-C, Lp(a),   IDL-C, and VLDL-C.  It is a surrogate marker for Apoprotein B.  Goal should be   less than 130 mg/dL.       Potassium   Date/Time Value Ref Range Status   06/18/2019 07:42 AM 3.9 3.5 - 5.1 mmol/L Final     Blood Urea Nitrogen   Date/Time Value Ref Range Status   06/18/2019 07:42 AM 15.0 8.4 - 25.7 mg/dL Final     Creatinine   Date/Time Value Ref Range Status   06/18/2019 07:42 AM 1.06 0.72 - 1.25 mg/dL Final     AST (SGOT)   Date/Time Value Ref Range Status   06/18/2019 07:42 AM 15 5 - 34 U/L Final     ALT (SGPT)   Date/Time Value Ref Range Status   06/18/2019 07:42 AM 21 0 - 55 U/L Final     TSH   Date/Time Value Ref Range Status   11/25/2016 11:58 AM 1.173 0.35 - 5.00 MCU/ML Final     Vitamin D(25-OH)Total   Date/Time Value Ref Range Status   11/25/2016 11:58 AM 30.9 30 - 80 NG/ML Final            Review of Systems   Gastrointestinal: Positive for diarrhea.   All other systems reviewed and are negative.        Objective:         ? aspirin EC 81 mg tablet Take 1 Tab by mouth daily. Take with food.   ? atorvastatin (LIPITOR) 40 mg tablet Take 1 tablet by mouth once daily   ? blood sugar diagnostic (ONETOUCH VERIO TEST STRIPS) test strip Use one strip as directed twice daily before meals.   ? finasteride (PROSCAR) 5 mg tablet Take 5 mg by mouth daily.   ? hydroCHLOROthiazide (HYDRODIURIL) 25 mg  tablet Take 1 tablet by mouth once daily   ? lancets 33 gauge (ONETOUCH DELICA 33 GUAGE) 33 gauge Use 1 each as directed twice daily as needed. E11.65  Collaborating MD:  Dr. Henrine Screws   ? metFORMIN-XR (GLUCOPHAGE XR) 500 mg extended release tablet TAKE 1 TABLET BY MOUTH IN THE MORNING WITH BREAKFAST AND 2 TABLETS IN THE EVENING WITH  DINNER   ? OZEMPIC 0.25 mg or 0.5 mg(2 mg/1.5 mL) injection PEN INJECT 0.25 MG SUBCUTANEOUSLY EVERY 7 DAYS IF TOLERATING MAY INCREASE TO 0.5MG  WEEKLY DOSE ON WEEK 5 (Patient taking differently: 0.5 mg.)   ? sertraline (ZOLOFT) 50 mg tablet TAKE ONE TABLET BY MOUTH ONCE DAILY   ? tamsulosin (FLOMAX) 0.4 mg capsule Take 1 capsule by mouth at bedtime daily.   ? valsartan (DIOVAN) 160 mg tablet Take 1 tablet by mouth once daily   ? verapamil  SR (CALAN-SR) 180 mg tablet Take one tablet by mouth daily.     Vitals:    07/24/20 1313   BP: 121/75   Pulse: 58   Temp: 36.6 ?C (97.9 ?F)   Weight: 134.7 kg (297 lb)   Height: 185.4 cm (73)   PainSc: Zero     Body mass index is 39.18 kg/m?Marland Kitchen     Physical Exam  Constitutional:       Appearance: He is well-developed. He is obese.   HENT:      Head: Normocephalic and atraumatic.   Neck:      Thyroid: No thyromegaly.   Pulmonary:      Effort: Pulmonary effort is normal.   Musculoskeletal:      Cervical back: Normal range of motion.   Skin:     General: Skin is warm and dry.   Neurological:      Mental Status: He is alert and oriented to person, place, and time.   Psychiatric:         Behavior: Behavior normal.          Assessment and Plan:  1.T2 DM,  controlled . A1C is 5.8 now, was 6.4 in 3/21, Target less than 7.   Continue metformin XR 500 mg BID with food  Increase to Ozempic 1.0 mg weekly for further wt loss and prandial glucose control.    Advised smaller portion sizes  Continue exercise and healthy eating    Check glucose weekly and may alternate between HS or fasting     2  Diet and life style modifications  Discussed patient's BMI with him.  The body mass index is 39.18 kg/m?Marland Kitchen and falls within the category of Obesity 3 (>40); counseled regarding weight loss.  Increase GLP-1    3. Hypertension at goal less than 130/80.  4. Lipid Continue lipitor 40 mg reduced every other day from daily due to knee pain.   5.  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  Check annual labs, will do this locally    RTC in 3 mo    Total time:  30 min with 15 min counseling about new med             Orders Placed This Encounter   ? GLUCOSE METER DOWNLOAD   ? PLATELET COUNT today   ? COMPREHENSIVE METABOLIC PANEL today   ? LIPID PROFILE today   ? TSH today   ? MICROALB/CR RATIO-URINE RANDOM today   ? HEMOGLOBIN A1C   ? POC HEMOGLOBIN A1C   ?  POC GLUCOSE QUANTITATIVE BLOOD   ? semaglutide (OZEMPIC) 1 mg/dose (4 mg/3 mL) injection PEN   ? metFORMIN-XR (GLUCOPHAGE XR) 500 mg extended release tablet

## 2020-10-18 ENCOUNTER — Encounter: Admit: 2020-10-18 | Discharge: 2020-10-18 | Payer: Private Health Insurance - Indemnity

## 2020-10-18 MED ORDER — ATORVASTATIN 40 MG PO TAB
ORAL_TABLET | Freq: Every day | 0 refills
Start: 2020-10-18 — End: ?

## 2020-11-24 ENCOUNTER — Encounter: Admit: 2020-11-24 | Discharge: 2020-11-24 | Payer: Private Health Insurance - Indemnity

## 2020-11-24 MED ORDER — HYDROCHLOROTHIAZIDE 25 MG PO TAB
ORAL_TABLET | Freq: Every day | 0 refills
Start: 2020-11-24 — End: ?

## 2021-01-07 ENCOUNTER — Encounter: Admit: 2021-01-07 | Discharge: 2021-01-07 | Payer: Private Health Insurance - Indemnity

## 2021-01-08 ENCOUNTER — Encounter: Admit: 2021-01-08 | Discharge: 2021-01-08 | Payer: BC Managed Care – PPO

## 2021-01-08 DIAGNOSIS — I10 Essential (primary) hypertension: Secondary | ICD-10-CM

## 2021-01-08 DIAGNOSIS — I2510 Atherosclerotic heart disease of native coronary artery without angina pectoris: Secondary | ICD-10-CM

## 2021-01-08 DIAGNOSIS — E785 Hyperlipidemia, unspecified: Secondary | ICD-10-CM

## 2021-01-08 DIAGNOSIS — G4730 Sleep apnea, unspecified: Secondary | ICD-10-CM

## 2021-01-08 DIAGNOSIS — Z87891 Personal history of nicotine dependence: Secondary | ICD-10-CM

## 2021-01-08 DIAGNOSIS — G4733 Obstructive sleep apnea (adult) (pediatric): Secondary | ICD-10-CM

## 2021-01-08 DIAGNOSIS — F419 Anxiety disorder, unspecified: Secondary | ICD-10-CM

## 2021-01-08 DIAGNOSIS — F32A Depression: Secondary | ICD-10-CM

## 2021-01-08 DIAGNOSIS — E119 Type 2 diabetes mellitus without complications: Secondary | ICD-10-CM

## 2021-01-08 MED ORDER — OZEMPIC 1 MG/DOSE (4 MG/3 ML) SC PNIJ
1 mg | SUBCUTANEOUS | 3 refills | Status: AC
Start: 2021-01-08 — End: ?

## 2021-01-08 MED ORDER — METFORMIN 500 MG PO TB24
500 mg | ORAL_TABLET | Freq: Two times a day (BID) | ORAL | 3 refills | Status: AC
Start: 2021-01-08 — End: ?

## 2021-01-08 MED ORDER — LANCETS 33 GAUGE MISC MISC
1 | Freq: Two times a day (BID) | 3 refills | Status: AC | PRN
Start: 2021-01-08 — End: ?

## 2021-01-08 MED ORDER — ONETOUCH VERIO TEST STRIPS MISC STRP
1 | ORAL_STRIP | Freq: Two times a day (BID) | 3 refills | Status: AC
Start: 2021-01-08 — End: ?

## 2021-01-08 NOTE — Progress Notes
Date of Service: 01/08/2021     Subjective:             Jeremy Bullock is a 60 y.o. male.    Diabetes      Pt presents to clinic for management of T2 DM.  He was last seen 6 mo ago with me and in 2020 with Dr Lajoyce Corners    Interval changes:   Tolerating ozempic  and has diarrhea if taking more than 1g of metformin xr.  He recovered from COVID in 1/22.  Would like to be able to lose wt.   A1C is 6.0 today, was previously 5.8 in 9/21, 6.4 in 3/21, 6.0 last visit in 2020  Wt today is 301, was 297 in 9/21, was 303 in 3/21, up from 289 lbs in 2020.      T2 DM:   [id number] in 2017  Current treatment: metformin XR 500 mg BID at meals, ozempic 1.0 mg weekly  Medication adherent: all of the time   Past treatment: used lantus and novolog while inpt after CABG  SMBG and consistency: daily One touch verio  Hyperglycemia: when he eats more  Hypoglycemia: none.  Felt symptoms when blood sugars in the 90 range especially when he took metformin and did not eat immediately.  Meals per day: 3.  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     Reporting Period: Feb 5 - Jan 08, 2021  Avg. Daily Readings In Range (mg/dL) from 28 readings  >981     0% (0 readings/day)  180-250  0% (0 readings/day)  70-180   100% (1 readings/day)  54-70    0% (0 readings/day)  <54      0% (0 readings/day)    Avg. Glucose (BGM): 124 mg/dL  Std. Deviation (BGM): 13 mg/dL    CV (BGM): 19%    BG Extents (BGM) (mg/dL)  Min BG  147  Max BG  155      Complications of DM:  CAD: yes, CABG in 2017  CVA: no  PVD: no  Amputations: no  Retinopathy: no  Gastropathy: no  Nephropathy: no, due for microalbumin over creatinine ratio 12.12 in 11/2016  Neuropathy: no    Had COVID vaccines    Medications:  Statin: yes  ACE-I or ARB: yes  ASA: yes    Medical History:   Diagnosis Date   ? Anxiety    ? Coronary artery disease    ? Depression    ? DM (diabetes mellitus) (HCC)    ? Dyslipidemia 05/22/2009   ? Essential (primary) hypertension 05/22/2009   ? History of tobacco use 05/22/2009   ? Obstructive sleep apnea    ? Sleep apnea 05/22/2009     Surgical History:   Procedure Laterality Date   ? Left Heart Cath With Ventriculogram N/A 01/01/2016    Performed by Cath, Physician at [facility] CATH LAB   ? Coronary Angiography N/A 01/01/2016    Performed by Cath, Physician at [facility] CATH LAB   ? Possible Percutaneous Coronary Intervention N/A 01/01/2016    Performed by Cath, Physician at [facility] CATH LAB   ? CORONARY ARTERY BYPASS GRAFT  01/04/16    three vessel   ? BYPASS GRAFT CORONARY ARTERY X3,  EVH, LIMA N/A 01/04/2016    Performed by Hendricks Milo, MD at [facility] CVOR   ? COLONOSCOPY N/A  01/08/2016    Performed by Everardo All, MD at [facility] ENDO   ? HX HEART CATHETERIZATION       Family History   Problem Relation Age of Onset   ? High Cholesterol Mother    ? Cancer Mother    ? Pulmonary HTN Mother    ? Diabetes Father    ? Coronary Artery Disease Father    ? Heart Attack Father      Social History     Socioeconomic History   ? Marital status: Married     Spouse name: Kennon Rounds   ? Number of children: 3   ? Years of education: 37   ? Highest education level: Not on file   Occupational History   ? Occupation: Maintenance Superintendent     Employer: [employer]   Tobacco Use   ? Smoking status: Never Smoker   ? Smokeless tobacco: Former Estate agent and Sexual Activity   ? Alcohol use: Yes     Alcohol/week: 1.0 - 2.0 standard drink     Types: 1 - 2 Cans of beer per week     Comment: occasionally   ? Drug use: No   ? Sexual activity: Yes     Partners: Female   Other Topics Concern   ? Not on file   Social History Narrative   ? Not on file       Labs:  Glucose, POC   Date/Time Value Ref Range Status   01/08/2021 01:37 PM 116  Final     Cholesterol   Date/Time Value Ref Range Status   06/18/2019 07:42 AM 109 <200 mg/dL Final     HDL   Date/Time Value Ref Range Status   06/18/2019 07:42 AM 33 (L) >40 mg/dL Final LDL   Date/Time Value Ref Range Status   06/18/2019 07:42 AM 59 <100 mg/dL Final     Triglycerides   Date/Time Value Ref Range Status   06/18/2019 07:42 AM 83 <150 mg/dL Final     Non HDL Cholesterol   Date/Time Value Ref Range Status   11/25/2016 11:58 AM 51 MG/DL Final     Comment:     Calculated non-HDL Cholesterol (non-HDL-C) indirectly measures LDL-C, Lp(a),   IDL-C, and VLDL-C.  It is a surrogate marker for Apoprotein B.  Goal should be   less than 130 mg/dL.       Potassium   Date/Time Value Ref Range Status   06/18/2019 07:42 AM 3.9 3.5 - 5.1 mmol/L Final     Blood Urea Nitrogen   Date/Time Value Ref Range Status   06/18/2019 07:42 AM 15.0 8.4 - 25.7 mg/dL Final     Creatinine   Date/Time Value Ref Range Status   06/18/2019 07:42 AM 1.06 0.72 - 1.25 mg/dL Final     AST (SGOT)   Date/Time Value Ref Range Status   06/18/2019 07:42 AM 15 5 - 34 U/L Final     ALT (SGPT)   Date/Time Value Ref Range Status   06/18/2019 07:42 AM 21 0 - 55 U/L Final     TSH   Date/Time Value Ref Range Status   11/25/2016 11:58 AM 1.173 0.35 - 5.00 MCU/ML Final     Vitamin D(25-OH)Total   Date/Time Value Ref Range Status   11/25/2016 11:58 AM 30.9 30 - 80 NG/ML Final            Review of Systems   Gastrointestinal: Positive for diarrhea.   All other  systems reviewed and are negative.        Objective:         ? aspirin EC 81 mg tablet Take 1 Tab by mouth daily. Take with food.   ? atorvastatin (LIPITOR) 40 mg tablet Take 1 tablet by mouth once daily   ? blood sugar diagnostic (ONETOUCH VERIO TEST STRIPS) test strip Use one strip as directed twice daily before meals.   ? finasteride (PROSCAR) 5 mg tablet Take 5 mg by mouth daily.   ? hydroCHLOROthiazide (HYDRODIURIL) 25 mg tablet Take 1 tablet by mouth once daily   ? lancets 33 gauge (ONETOUCH DELICA 33 GUAGE) 33 gauge Use 1 each as directed twice daily as needed. E11.65  Collaborating MD:  Dr. Henrine Screws   ? metFORMIN-XR (GLUCOPHAGE XR) 500 mg extended release tablet Take one tablet by mouth twice daily with meals. TAKE 1 TABLET BY MOUTH IN THE MORNING WITH BREAKFAST AND 2 TABLETS IN THE EVENING WITH  DINNER   ? semaglutide (OZEMPIC) 1 mg/dose (4 mg/3 mL) injection PEN Inject 0.75 mL under the skin every 7 days.   ? sertraline (ZOLOFT) 50 mg tablet TAKE ONE TABLET BY MOUTH ONCE DAILY   ? tamsulosin (FLOMAX) 0.4 mg capsule Take 1 capsule by mouth at bedtime daily.   ? valsartan (DIOVAN) 160 mg tablet Take 1 tablet by mouth once daily   ? verapamil  SR (CALAN-SR) 180 mg tablet Take one tablet by mouth daily.     Vitals:    01/08/21 1325   BP: 125/84   Pulse: 58   Temp: 36.2 ?C (97.1 ?F)   Weight: (!) 136.5 kg (301 lb)   Height: 185.4 cm (6' 1)   PainSc: Zero     Body mass index is 39.71 kg/m?Marland Kitchen     Physical Exam  Constitutional:       Appearance: He is well-developed. He is obese.   HENT:      Head: Normocephalic and atraumatic.   Neck:      Thyroid: No thyromegaly.   Pulmonary:      Effort: Pulmonary effort is normal.   Musculoskeletal:      Cervical back: Normal range of motion.   Skin:     General: Skin is warm and dry.   Neurological:      Mental Status: He is alert and oriented to person, place, and time.   Psychiatric:         Behavior: Behavior normal.     Diabetic Foot Exam Bilateral vascular, sensation, integument are normal:  Yes but reports that they get cold easily       Assessment and Plan:  1.T2 DM,  controlled . A1C is 6.0 now, target less than 7.   Continue metformin XR 500 mg BID with food  continue Ozempic 1.0 mg weekly for further wt loss and prandial glucose control.    Advised smaller portion sizes  Continue exercise and healthy eating    Check glucose weekly and may alternate between HS or fasting     2  Diet and life style modifications  Discussed patient's BMI with him.  The body mass index is 39.71 kg/m?Marland Kitchen and falls within the category of Obesity 2; counseled regarding weight loss.  Increase GLP-1    3. Hypertension at goal less than 130/80.  4. Lipid Continue lipitor 40 mg reduced every other day from daily due to knee pain.   5.  Diabetes microvascular complications: no history of retinopathy and due for  MACR was normal November 25, 2016.  Check annual labs, will do this locally    RTC in 6-12 mo    Total time:  30 min with 15 min counseling about new med             Orders Placed This Encounter   ? GLUCOSE METER DOWNLOAD   ? PLATELET COUNT today   ? COMPREHENSIVE METABOLIC PANEL today   ? LIPID PROFILE today   ? TSH today   ? MICROALB/CR RATIO-URINE RANDOM today   ? HEMOGLOBIN A1C   ? POC HEMOGLOBIN A1C   ? POC GLUCOSE QUANTITATIVE BLOOD   ? semaglutide (OZEMPIC) 1 mg/dose (4 mg/3 mL) injection PEN   ? metFORMIN-XR (GLUCOPHAGE XR) 500 mg extended release tablet   ? blood sugar diagnostic (ONETOUCH VERIO TEST STRIPS) test strip   ? lancets 33 gauge (ONETOUCH DELICA 33 GUAGE) 33 gauge

## 2021-01-09 ENCOUNTER — Ambulatory Visit: Admit: 2021-01-08 | Discharge: 2021-01-09 | Payer: BC Managed Care – PPO

## 2021-01-20 ENCOUNTER — Encounter: Admit: 2021-01-20 | Discharge: 2021-01-20 | Payer: BC Managed Care – PPO

## 2021-01-20 DIAGNOSIS — E119 Type 2 diabetes mellitus without complications: Secondary | ICD-10-CM

## 2021-01-20 MED ORDER — OZEMPIC 1 MG/DOSE (4 MG/3 ML) SC PNIJ
1 mg | SUBCUTANEOUS | 3 refills | Status: AC
Start: 2021-01-20 — End: ?

## 2021-02-25 ENCOUNTER — Encounter: Admit: 2021-02-25 | Discharge: 2021-02-25 | Payer: BC Managed Care – PPO

## 2021-02-25 MED ORDER — HYDROCHLOROTHIAZIDE 25 MG PO TAB
25 mg | ORAL_TABLET | Freq: Every day | ORAL | 3 refills | 28.00000 days | Status: AC
Start: 2021-02-25 — End: ?

## 2021-03-09 ENCOUNTER — Encounter: Admit: 2021-03-09 | Discharge: 2021-03-09 | Payer: BC Managed Care – PPO

## 2021-03-09 DIAGNOSIS — E119 Type 2 diabetes mellitus without complications: Secondary | ICD-10-CM

## 2021-03-09 MED ORDER — METFORMIN 500 MG PO TB24
ORAL_TABLET | 3 refills | Status: AC
Start: 2021-03-09 — End: ?

## 2021-03-09 MED ORDER — VALSARTAN 160 MG PO TAB
160 mg | ORAL_TABLET | Freq: Every day | ORAL | 3 refills | 60.00000 days | Status: AC
Start: 2021-03-09 — End: ?

## 2021-03-09 MED ORDER — ONETOUCH VERIO TEST STRIPS MISC STRP
1 | ORAL_STRIP | Freq: Two times a day (BID) | 3 refills | Status: AC
Start: 2021-03-09 — End: ?

## 2021-03-15 ENCOUNTER — Encounter: Admit: 2021-03-15 | Discharge: 2021-03-15 | Payer: BC Managed Care – PPO

## 2021-03-15 MED ORDER — ATORVASTATIN 40 MG PO TAB
40 mg | ORAL_TABLET | Freq: Every day | ORAL | 3 refills | Status: AC
Start: 2021-03-15 — End: ?

## 2021-08-19 ENCOUNTER — Encounter: Admit: 2021-08-19 | Discharge: 2021-08-19 | Payer: BC Managed Care – PPO

## 2021-08-19 DIAGNOSIS — E119 Type 2 diabetes mellitus without complications: Secondary | ICD-10-CM

## 2021-08-19 LAB — PLATELET COUNT: PLATELET COUNT: 150 x10-3/uL (ref 130–400)

## 2021-08-19 LAB — MICROALB/CR RATIO-URINE RANDOM
MICROALBUMIN, RAN: 5 mg/dL
MICROALBUMIN/CR RATIO URINE: 4.5 mg/g (ref <30.0)
UR CREATININE, RAN: 109 mg/dL (ref 63–166)

## 2021-08-23 ENCOUNTER — Encounter: Admit: 2021-08-23 | Discharge: 2021-08-23 | Payer: BC Managed Care – PPO

## 2021-08-23 DIAGNOSIS — E119 Type 2 diabetes mellitus without complications: Secondary | ICD-10-CM

## 2021-08-26 ENCOUNTER — Encounter: Admit: 2021-08-26 | Discharge: 2021-08-26 | Payer: BC Managed Care – PPO

## 2021-08-26 DIAGNOSIS — F32A Depression: Secondary | ICD-10-CM

## 2021-08-26 DIAGNOSIS — I2510 Atherosclerotic heart disease of native coronary artery without angina pectoris: Secondary | ICD-10-CM

## 2021-08-26 DIAGNOSIS — Z136 Encounter for screening for cardiovascular disorders: Secondary | ICD-10-CM

## 2021-08-26 DIAGNOSIS — G4733 Obstructive sleep apnea (adult) (pediatric): Secondary | ICD-10-CM

## 2021-08-26 DIAGNOSIS — I10 Essential (primary) hypertension: Secondary | ICD-10-CM

## 2021-08-26 DIAGNOSIS — Z87891 Personal history of nicotine dependence: Secondary | ICD-10-CM

## 2021-08-26 DIAGNOSIS — E785 Hyperlipidemia, unspecified: Secondary | ICD-10-CM

## 2021-08-26 DIAGNOSIS — E119 Type 2 diabetes mellitus without complications: Secondary | ICD-10-CM

## 2021-08-26 DIAGNOSIS — F419 Anxiety disorder, unspecified: Secondary | ICD-10-CM

## 2021-08-26 DIAGNOSIS — G4730 Sleep apnea, unspecified: Secondary | ICD-10-CM

## 2021-08-26 NOTE — Progress Notes
Date of Service: 08/26/2021    Jeremy Bullock is a 60 y.o. male.       HPI    Jeremy Bullock?is followed for coronary artery disease, hypertension and hypercholesterolemia. ?He is also followed for type 2 diabetes mellitus.  He reports that his last hemoglobin A1c was 5.6%.  His diverticulitis appears to be under good control.   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  Jeremy Bullock reports having COVID in December 2021.  He was pretty sick over 3 weeks with this COVID infection.  He then had COVID a second time in August 2022 but was symptomatic for only 2 days.  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 Otherwise, over the past?6?months,?the patient has been doing well and reports no angina, congestive symptoms, palpitations, sensation of sustained forceful heart pounding, lightheadedness or syncope.??His exercise tolerance has been stable, although he does not have a regular exercise routine.? He is active at work but does not have a regular exercise routine.  He does have an elliptical and stationary bicycle at home that he does not use. ?He states that he usually gets 8000 steps per day.?The patient reports no?claudication,?myalgias, bleeding abnormalities, neurologic motor abnormalities or difficulty with speech.??  Historically,?I saw Jeremy Bullock back on 12/31/2015. ?At that time he reported exertional chest discomfort suggestive for angina pectoris. ?Cornary angiography was performed on 01/01/2016 and revealed severe three-vessel coronary disease. ?On 01/04/2016, he then underwent coronary bypass grafting times 3 with left internal mammary artery to LAD, reverse saphenous vein graft to OM1, and reverse saphenous vein graft to D2.?       Vitals:    08/26/21 1549   BP: 120/68   BP Source: Arm, Left Upper Pulse: 70   SpO2: 96%   O2 Device: None (Room air)   PainSc: Zero   Weight: 135.6 kg (299 lb)   Height: 185.4 cm (6' 1)     Body mass index is 39.45 kg/m?Marland Kitchen     Past Medical History  Patient Active Problem List    Diagnosis Date Noted   ? Abnormal chest x-ray 07/13/2017   ? Coronary artery disease due to calcified coronary lesion 02/11/2016     01/2016 - CABG     ? Type 2 diabetes mellitus (HCC) 02/19/2015     07/2014 - Fasting glucose 162.  Hgb A1c 7.4%       ? Morbid obesity (HCC) 08/02/2011   ? Essential (primary) hypertension 05/22/2009   ? History of tobacco use 05/22/2009   ? Dyslipidemia 05/22/2009     borderline     ? Sleep apnea 05/22/2009     2004 - Diagnosis by sleep study at [facility], ordered by Dr. Gwenette Greet.  CPAP started.    09/2012 - Office consultation with Dr. Lucilla Lame.  Follow-up sleep study scheduled for 10/2012.           Review of Systems   Constitutional: Negative.   HENT: Negative.    Eyes: Negative.    Cardiovascular: Negative.    Respiratory: Negative.    Endocrine: Negative.    Hematologic/Lymphatic: Negative.    Skin: Negative.    Musculoskeletal: Negative.    Gastrointestinal: Negative.    Genitourinary: Negative.    Neurological: Negative.    Psychiatric/Behavioral: Negative.  Allergic/Immunologic: Negative.        Physical Exam  GENERAL: The patient is well developed,?overweight,?well nourished, resting comfortably and in no distress.   HEENT: No abnormalities of the visible oro-nasopharynx, conjunctiva or sclera are noted.  NECK: There is no jugular venous distension. Carotids are palpable and without bruits. There is no thyroid enlargement.  Chest: Lung fields are clear to auscultation. There are no wheezes or crackles.  CV: There is a regular rhythm. The first and second heart sounds are normal. There are no murmurs, gallops or rubs.??His apical heart rate is?64 bpm.  ABD: The abdomen is soft and supple with normal bowel sounds. There is no hepatosplenomegaly, ascites, tenderness, masses or bruits.  Neuro: There are no focal motor defects. Ambulation is normal. Cognitive function appears normal.  Ext:?There is no edema or evidence of deep vein thrombosis. Peripheral pulses are satisfactory. ?  SKIN:?There are no rashes and no cellulitis  PSYCH:?The patient is calm, rationale and oriented    Cardiovascular Studies  A twelve-lead ECG obtained on 08/26/2021 reveals sinus rhythm with a heart rate of 66 bpm.  Multiple premature atrial beats are seen.  Nondiagnostic ST-T wave abnormalities are seen which are not appreciably different when compared with the prior ECG from July 09, 2020.    Cardiovascular Health Factors  Vitals BP Readings from Last 3 Encounters:   08/26/21 120/68   01/08/21 125/84   07/24/20 121/75     Wt Readings from Last 3 Encounters:   08/26/21 135.6 kg (299 lb)   01/08/21 (!) 136.5 kg (301 lb)   07/24/20 134.7 kg (297 lb)     BMI Readings from Last 3 Encounters:   08/26/21 39.45 kg/m?   01/08/21 39.71 kg/m?   07/24/20 39.18 kg/m?      Smoking Social History     Tobacco Use   Smoking Status Never Smoker   Smokeless Tobacco Former Neurosurgeon   ? Quit date: 11/07/1996      Lipid Profile Cholesterol   Date Value Ref Range Status   06/18/2019 109 <200 mg/dL Final     HDL   Date Value Ref Range Status   06/18/2019 33 (L) >40 mg/dL Final     LDL   Date Value Ref Range Status   06/18/2019 59 <100 mg/dL Final     Triglycerides   Date Value Ref Range Status   06/18/2019 83 <150 mg/dL Final      Blood Sugar Hemoglobin A1C   Date Value Ref Range Status   08/19/2021 5.6  Final     Glucose   Date Value Ref Range Status   08/19/2021 138 70 - 105 mg/dL Final   16/08/9603 540 (H) 70 - 105 mg/dL Final   98/09/9146 829 (H) 70 - 105 Final     Glucose, POC   Date Value Ref Range Status   01/08/2021 116  Final   01/31/2020 169  Final   06/07/2019 137  Final          Problems Addressed Today  Encounter Diagnoses   Name Primary?   ? Screening for heart disease Yes   ? Essential (primary) hypertension    ? Morbid obesity (HCC)    ? Coronary artery disease due to calcified coronary lesion    ? Obstructive sleep apnea syndrome    ? Dyslipidemia        Assessment and Plan     Mr. Arvizo is doing well from a cardiovascular perspective and reports no angina or congestive  symptoms.  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  Alternatives for further diagnosis were reviewed with the patient and he wanted to obtain an echo Doppler study to assess for any structural cardiac abnormalities. ?There is no evidence of congestive heart failure on examination. ?His blood pressure is well controlled and his LDL cholesterol appears well controlled.   I did give him a requisition to check his fasting lipid profile and ALT.  Regular mild aerobic exercise, weight loss and adherence to a heart healthy diabetic diet were recommended. ?Otherwise, I?have asked Mr. Gilliam to return for follow-up in approximately 6 months time. The total time spent during this interview and exam was 30 minutes.         Current Medications (including today's revisions)  ? ascorbic acid (VITAMIN C PO) Take 1 tablet by mouth daily.   ? aspirin EC 81 mg tablet Take 1 Tab by mouth daily. Take with food.   ? atorvastatin (LIPITOR) 40 mg tablet Take one tablet by mouth daily.   ? blood sugar diagnostic (ONETOUCH VERIO TEST STRIPS) test strip Use one strip as directed twice daily before meals.   ? finasteride (PROSCAR) 5 mg tablet Take 5 mg by mouth daily.   ? hydroCHLOROthiazide (HYDRODIURIL) 25 mg tablet Take one tablet by mouth daily.   ? lancets 33 gauge (ONETOUCH DELICA 33 GUAGE) 33 gauge Use one each as directed twice daily as needed. E11.65  Collaborating MD:  Dr. Henrine Screws   ? metFORMIN-XR (GLUCOPHAGE XR) 500 mg extended release tablet TAKE 1 TABLET BY MOUTH IN THE MORNING WITH BREAKFAST AND 2 TABLETS IN THE EVENING WITH  DINNER   ? semaglutide (OZEMPIC) 1 mg/dose (4 mg/3 mL) injection PEN Inject 0.75 mL under the skin every 7 days. 0.75 mL = 1 mg   ? sertraline (ZOLOFT) 50 mg tablet TAKE ONE TABLET BY MOUTH ONCE DAILY   ? tamsulosin (FLOMAX) 0.4 mg capsule Take 1 capsule by mouth at bedtime daily.   ? valsartan (DIOVAN) 160 mg tablet Take one tablet by mouth daily.   ? verapamil  SR (CALAN-SR) 180 mg tablet Take one tablet by mouth daily.

## 2021-09-15 ENCOUNTER — Ambulatory Visit: Admit: 2021-09-15 | Discharge: 2021-09-15 | Payer: BC Managed Care – PPO

## 2021-09-15 ENCOUNTER — Encounter: Admit: 2021-09-15 | Discharge: 2021-09-15 | Payer: BC Managed Care – PPO

## 2021-09-15 DIAGNOSIS — I10 Essential (primary) hypertension: Secondary | ICD-10-CM

## 2021-09-15 DIAGNOSIS — I2510 Atherosclerotic heart disease of native coronary artery without angina pectoris: Secondary | ICD-10-CM

## 2021-09-15 DIAGNOSIS — E785 Hyperlipidemia, unspecified: Secondary | ICD-10-CM

## 2021-09-15 DIAGNOSIS — Z136 Encounter for screening for cardiovascular disorders: Secondary | ICD-10-CM

## 2021-09-15 DIAGNOSIS — G4733 Obstructive sleep apnea (adult) (pediatric): Secondary | ICD-10-CM

## 2021-09-15 MED ORDER — PERFLUTREN LIPID MICROSPHERES 1.1 MG/ML IV SUSP
1-10 mL | Freq: Once | INTRAVENOUS | 0 refills | Status: CP | PRN
Start: 2021-09-15 — End: ?

## 2021-09-17 ENCOUNTER — Encounter: Admit: 2021-09-17 | Discharge: 2021-09-17 | Payer: BC Managed Care – PPO

## 2021-09-17 NOTE — Telephone Encounter
-----   Message from Hester Mates, MD sent at 09/17/2021 11:31 AM CST -----  To all: Favorable echo Doppler study.  Good LV function and no significant valvular heart disease.  Please let him know.  Thanks.  [facility]  ----- Message -----  From: Glynda Jaeger, MD  Sent: 09/15/2021  12:03 PM CST  To: Hester Mates, MD

## 2021-12-24 ENCOUNTER — Encounter: Admit: 2021-12-24 | Discharge: 2021-12-24 | Payer: BC Managed Care – PPO

## 2021-12-24 DIAGNOSIS — E119 Type 2 diabetes mellitus without complications: Secondary | ICD-10-CM

## 2021-12-24 MED ORDER — OZEMPIC 1 MG/DOSE (4 MG/3 ML) SC PNIJ
3 refills
Start: 2021-12-24 — End: ?

## 2022-02-04 ENCOUNTER — Encounter: Admit: 2022-02-04 | Discharge: 2022-02-04 | Payer: BC Managed Care – PPO

## 2022-02-04 NOTE — Progress Notes
Contacted patient by phone regarding labs ordered per Dr. Dedra Skeens for Lipid Panel and ALT that has not been completed. Patient requested lab orders be faxed to [facility] (FAX: [phone]). Faxed lab orders per patient's request. Notified patient of fasting instructions for lab draw. Patient will have labs drawn before upcoming appointment with [facility] on 02/15/2022.

## 2022-02-07 ENCOUNTER — Encounter: Admit: 2022-02-07 | Discharge: 2022-02-07 | Payer: BC Managed Care – PPO

## 2022-02-07 DIAGNOSIS — G4733 Obstructive sleep apnea (adult) (pediatric): Secondary | ICD-10-CM

## 2022-02-07 DIAGNOSIS — I2510 Atherosclerotic heart disease of native coronary artery without angina pectoris: Secondary | ICD-10-CM

## 2022-02-07 DIAGNOSIS — E785 Hyperlipidemia, unspecified: Secondary | ICD-10-CM

## 2022-02-07 DIAGNOSIS — Z136 Encounter for screening for cardiovascular disorders: Secondary | ICD-10-CM

## 2022-02-07 DIAGNOSIS — E119 Type 2 diabetes mellitus without complications: Secondary | ICD-10-CM

## 2022-02-07 DIAGNOSIS — I10 Essential (primary) hypertension: Secondary | ICD-10-CM

## 2022-02-07 MED ORDER — OZEMPIC 1 MG/DOSE (4 MG/3 ML) SC PNIJ
1 mg | SUBCUTANEOUS | 1 refills | Status: AC
Start: 2022-02-07 — End: ?

## 2022-02-15 ENCOUNTER — Encounter: Admit: 2022-02-15 | Discharge: 2022-02-15 | Payer: BC Managed Care – PPO

## 2022-02-15 DIAGNOSIS — I10 Essential (primary) hypertension: Secondary | ICD-10-CM

## 2022-02-15 DIAGNOSIS — G4733 Obstructive sleep apnea (adult) (pediatric): Secondary | ICD-10-CM

## 2022-02-15 DIAGNOSIS — Z136 Encounter for screening for cardiovascular disorders: Secondary | ICD-10-CM

## 2022-02-15 DIAGNOSIS — E785 Hyperlipidemia, unspecified: Secondary | ICD-10-CM

## 2022-02-15 DIAGNOSIS — Z87891 Personal history of nicotine dependence: Secondary | ICD-10-CM

## 2022-02-15 DIAGNOSIS — F419 Anxiety disorder, unspecified: Secondary | ICD-10-CM

## 2022-02-15 DIAGNOSIS — F32A Depression: Secondary | ICD-10-CM

## 2022-02-15 DIAGNOSIS — G4730 Sleep apnea, unspecified: Secondary | ICD-10-CM

## 2022-02-15 DIAGNOSIS — E119 Type 2 diabetes mellitus without complications: Secondary | ICD-10-CM

## 2022-02-15 DIAGNOSIS — I2510 Atherosclerotic heart disease of native coronary artery without angina pectoris: Secondary | ICD-10-CM

## 2022-02-15 NOTE — Progress Notes
Date of Service: 02/15/2022    Jeremy Bullock is a 61 y.o. male.       HPI    Jeremy Bullock?is followed for coronary artery disease, hypertension and hypercholesterolemia. ?He is also followed for type 2 diabetes mellitus. His diverticulitis appears to be under good control.?  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 Otherwise, over the past?6?months,?the patient has been doing well and reports no angina, congestive symptoms, palpitations, sensation of sustained forceful heart pounding, lightheadedness or syncope.??His exercise tolerance has been stable, although he does not have a regular exercise routine.? He is active at work but does not have a regular exercise routine.  He does have an elliptical and stationary bicycle at home that he does not use. ?He states that he usually gets 8000 steps per day.?The patient reports no?claudication,?myalgias, bleeding abnormalities, neurologic motor abnormalities or difficulty with speech.??Jeremy Bullock reports that he initially lost 15 pounds when he first started taking Ozempic, but then his weight leveled off.  He has been taking Ozempic for approximately 2 years now.  Historically,?I saw Jeremy Bullock back on 12/31/2015. ?At that time he reported exertional chest discomfort suggestive for angina pectoris. ?Cornary angiography was performed on 01/01/2016 and revealed severe three-vessel coronary disease. ?On 01/04/2016, he then underwent coronary bypass grafting times 3 with left internal mammary artery to LAD, reverse saphenous vein graft to OM1, and reverse saphenous vein graft to D2.?  Jeremy Bullock reports having COVID in December 2021.  He was pretty sick over 3 weeks with this COVID infection.  He then had COVID a second time in August 2022 but was symptomatic for only 2 days.         Vitals:    02/15/22 0959   BP: 130/78   BP Source: Arm, Left Upper   Pulse: 54   SpO2: 98%   O2 Percent: 98 %   O2 Device: None (Room air)   PainSc: Zero   Weight: (!) 136.2 kg (300 lb 3.2 oz)   Height: 185.4 cm (6' 1)     Body mass index is 39.61 kg/m?Marland Kitchen     Past Medical History  Patient Active Problem List    Diagnosis Date Noted   ? Abnormal chest x-ray 07/13/2017   ? Coronary artery disease due to calcified coronary lesion 02/11/2016     01/2016 - CABG     ? Type 2 diabetes mellitus (HCC) 02/19/2015     07/2014 - Fasting glucose 162.  Hgb A1c 7.4%       ? Morbid obesity (HCC) 08/02/2011   ? Essential (primary) hypertension 05/22/2009   ? History of tobacco use 05/22/2009   ? Dyslipidemia 05/22/2009     borderline     ? Sleep apnea 05/22/2009     2004 - Diagnosis by sleep study at [facility], ordered by Dr. Gwenette Greet.  CPAP started.    09/2012 - Office consultation with Dr. Lucilla Lame.  Follow-up sleep study scheduled for 10/2012.           Review of Systems   Constitutional: Positive for malaise/fatigue.   HENT: Negative.    Eyes: Negative.    Cardiovascular: Negative.    Respiratory: Negative.    Endocrine: Negative.    Hematologic/Lymphatic: Negative.    Skin: Negative.    Musculoskeletal: Positive for muscle cramps.   Gastrointestinal: Negative.    Genitourinary: Negative.  Neurological: Negative.    Psychiatric/Behavioral: Negative.    Allergic/Immunologic: Negative.        Physical Exam  GENERAL: The patient is well developed,?overweight,?well nourished, resting comfortably and in no distress.   HEENT: No abnormalities of the visible oro-nasopharynx, conjunctiva or sclera are noted.  NECK: There is no jugular venous distension. Carotids are palpable and without bruits. There is no thyroid enlargement.  Chest: Lung fields are clear to auscultation. There are no wheezes or crackles.  CV: There is a regular rhythm. The first and second heart sounds are normal. There are no murmurs, gallops or rubs.??His apical heart rate is?60?bpm.  ABD: The abdomen is soft and supple with normal bowel sounds. There is no hepatosplenomegaly, ascites, tenderness, masses or bruits.  Neuro: There are no focal motor defects. Ambulation is normal. Cognitive function appears normal.  Ext:?There is no edema or evidence of deep vein thrombosis. Peripheral pulses are satisfactory. ?  SKIN:?There are no rashes and no cellulitis  PSYCH:?The patient is calm, rationale and oriented    Cardiovascular Studies  A twelve-lead ECG obtained on 08/26/2021 reveals sinus rhythm with a heart rate of 66 bpm.  Multiple premature atrial beats are seen.  Nondiagnostic ST-T wave abnormalities are seen which are not appreciably different when compared with the prior ECG from July 09, 2020.    Labs from 08/19/2021 revealed serum potassium 4.0 mmol/L, serum creatinine 1.06 mg/dL and hemoglobin A5W 0.9%.    Echo Doppler 09/15/2021:  Interpretation Summary    ? The left ventricular size is normal. Concentric remodeling. The left ventricular systolic function is hyperdynamic. The visually estimated ejection fraction is 70%. There are no segmental wall motion abnormalities.  ? The right ventricle is probably normal in size. The right ventricular systolic function is normal.  ? Normal biatrial size.  ? No significant valvular disease.  ? The pulmonary artery pressure could not be estimated due to inadequate tricuspid regurgitation signal.  ? No pericardial effusion.     Echocardiographic Findings    Left Ventricle The left ventricular size is normal. Concentric remodeling. The left ventricular systolic function is hyperdynamic. The visually estimated ejection fraction is 70%. There are no segmental wall motion abnormalities. Normal left ventricular diastolic function. Normal left atrial pressure.   Right Ventricle The right ventricle is probably normal in size. The right ventricular systolic function is normal. The pulmonary artery pressure could not be estimated due to inadequate tricuspid regurgitation signal.   Left Atrium Normal size.   Right Atrium Normal size.   IVC/SVC Normal central venous pressure (0-5 mm Hg).   Mitral Valve Normal valve structure. No stenosis. Trace regurgitation.   Tricuspid Valve Normal valve structure. No stenosis. No regurgitation.   Aortic Valve Probably tricuspid aortic valve present.  No stenosis. No regurgitation.   Pulmonary Normal valve structure. No stenosis. No regurgitation.   Aorta The aortic root and ascending aorta are normal in size.   Pericardium No pericardial effusion.       Cardiovascular Health Factors  Vitals BP Readings from Last 3 Encounters:   02/15/22 130/78   09/15/21 (!) 140/88   08/26/21 120/68     Wt Readings from Last 3 Encounters:   02/15/22 (!) 136.2 kg (300 lb 3.2 oz)   09/15/21 135.3 kg (298 lb 3.2 oz)   08/26/21 135.6 kg (299 lb)     BMI Readings from Last 3 Encounters:   02/15/22 39.61 kg/m?   09/15/21 39.34 kg/m?   08/26/21 39.45 kg/m?  Smoking Social History     Tobacco Use   Smoking Status Never   Smokeless Tobacco Former   ? Quit date: 11/07/1996      Lipid Profile Cholesterol   Date Value Ref Range Status   02/05/2022 108  Final     HDL   Date Value Ref Range Status   02/05/2022 34 (L) >=40 Final     LDL   Date Value Ref Range Status   02/05/2022 56  Final     Triglycerides   Date Value Ref Range Status   02/05/2022 90  Final      Blood Sugar Hemoglobin A1C   Date Value Ref Range Status   08/19/2021 5.6  Final     Glucose   Date Value Ref Range Status   08/19/2021 138 70 - 105 mg/dL Final   45/40/9811 914 (H) 70 - 105 mg/dL Final   78/29/5621 308 (H) 70 - 105 Final     Glucose, POC   Date Value Ref Range Status   01/08/2021 116  Final   01/31/2020 169  Final   06/07/2019 137  Final          Problems Addressed Today  Encounter Diagnoses   Name Primary?   ? Screening for heart disease    ? Essential (primary) hypertension    ? Morbid obesity (HCC)    ? Coronary artery disease due to calcified coronary lesion    ? Obstructive sleep apnea syndrome    ? Dyslipidemia        Assessment and Plan    Jeremy Bullock continues to do well from a cardiovascular perspective and reports no angina or congestive symptoms.  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  The echo Doppler study obtained on 09/15/2021 showed normal left ventricular systolic function. ?His blood pressure is well controlled and his LDL cholesterol and hemoglobin A1c appear well controlled.? Regular?mild?aerobic exercise, weight loss and adherence to a heart healthy?diabetic?diet were recommended. ?Otherwise, I?have asked Jeremy Bullock to return for follow-up in approximately?12?months time. The total time spent during this interview and exam was 30 minutes.         Current Medications (including today's revisions)  ? ascorbic acid (VITAMIN C PO) Take 1 tablet by mouth daily.   ? aspirin EC 81 mg tablet Take 1 Tab by mouth daily. Take with food.   ? atorvastatin (LIPITOR) 40 mg tablet Take one tablet by mouth daily.   ? blood sugar diagnostic (ONETOUCH VERIO TEST STRIPS) test strip Use one strip as directed twice daily before meals.   ? finasteride (PROSCAR) 5 mg tablet Take one tablet by mouth daily.   ? hydroCHLOROthiazide (HYDRODIURIL) 25 mg tablet Take one tablet by mouth daily.   ? lancets 33 gauge (ONETOUCH DELICA 33 GUAGE) 33 gauge Use one each as directed twice daily as needed. E11.65  Collaborating MD:  Dr. Henrine Screws   ? metFORMIN-XR (GLUCOPHAGE XR) 500 mg extended release tablet TAKE 1 TABLET BY MOUTH IN THE MORNING WITH BREAKFAST AND 2 TABLETS IN THE EVENING WITH  DINNER   ? semaglutide (OZEMPIC) 1 mg/dose (4 mg/3 mL) injection PEN Inject one mg under the skin every 7 days. 0.75 mL = 1 mg   ? sertraline (ZOLOFT) 50 mg tablet TAKE ONE TABLET BY MOUTH ONCE DAILY   ? tamsulosin (FLOMAX) 0.4 mg capsule Take one capsule by mouth at bedtime daily.   ? valsartan (DIOVAN) 160 mg tablet Take  one tablet by mouth daily.   ? verapamil  SR (CALAN-SR) 180 mg tablet Take one tablet by mouth daily.

## 2022-03-07 ENCOUNTER — Encounter: Admit: 2022-03-07 | Discharge: 2022-03-07 | Payer: BC Managed Care – PPO

## 2022-03-07 DIAGNOSIS — E119 Type 2 diabetes mellitus without complications: Secondary | ICD-10-CM

## 2022-03-07 MED ORDER — ONETOUCH VERIO TEST STRIPS MISC STRP
ORAL_STRIP | 0 refills | 30.00000 days | Status: AC
Start: 2022-03-07 — End: ?

## 2022-03-07 MED ORDER — VALSARTAN 160 MG PO TAB
ORAL_TABLET | ORAL | 3 refills | 60.00000 days | Status: AC
Start: 2022-03-07 — End: ?

## 2022-03-07 MED ORDER — METFORMIN 500 MG PO TB24
ORAL_TABLET | 0 refills | Status: AC
Start: 2022-03-07 — End: ?

## 2022-03-21 ENCOUNTER — Encounter: Admit: 2022-03-21 | Discharge: 2022-03-21 | Payer: BC Managed Care – PPO

## 2022-03-21 MED ORDER — ATORVASTATIN 40 MG PO TAB
ORAL_TABLET | 3 refills | Status: AC
Start: 2022-03-21 — End: ?

## 2022-05-02 ENCOUNTER — Encounter: Admit: 2022-05-02 | Discharge: 2022-05-02 | Payer: BC Managed Care – PPO

## 2022-05-02 MED ORDER — HYDROCHLOROTHIAZIDE 25 MG PO TAB
ORAL_TABLET | 3 refills
Start: 2022-05-02 — End: ?

## 2022-06-06 ENCOUNTER — Encounter: Admit: 2022-06-06 | Discharge: 2022-06-06 | Payer: BC Managed Care – PPO

## 2022-06-06 DIAGNOSIS — E119 Type 2 diabetes mellitus without complications: Secondary | ICD-10-CM

## 2022-06-06 MED ORDER — METFORMIN 500 MG PO TB24
ORAL_TABLET | 0 refills | Status: AC
Start: 2022-06-06 — End: ?

## 2022-06-06 MED ORDER — ONETOUCH VERIO TEST STRIPS MISC STRP
ORAL_STRIP | 0 refills | 30.00000 days | Status: AC
Start: 2022-06-06 — End: ?

## 2022-06-15 ENCOUNTER — Encounter: Admit: 2022-06-15 | Discharge: 2022-06-15 | Payer: BC Managed Care – PPO

## 2022-06-15 ENCOUNTER — Ambulatory Visit: Admit: 2022-06-15 | Discharge: 2022-06-15 | Payer: BC Managed Care – PPO

## 2022-06-15 DIAGNOSIS — I10 Essential (primary) hypertension: Secondary | ICD-10-CM

## 2022-06-15 DIAGNOSIS — E785 Hyperlipidemia, unspecified: Secondary | ICD-10-CM

## 2022-06-15 DIAGNOSIS — F32A Depression: Secondary | ICD-10-CM

## 2022-06-15 DIAGNOSIS — G4733 Obstructive sleep apnea (adult) (pediatric): Secondary | ICD-10-CM

## 2022-06-15 DIAGNOSIS — I2510 Atherosclerotic heart disease of native coronary artery without angina pectoris: Secondary | ICD-10-CM

## 2022-06-15 DIAGNOSIS — Z87891 Personal history of nicotine dependence: Secondary | ICD-10-CM

## 2022-06-15 DIAGNOSIS — G4730 Sleep apnea, unspecified: Secondary | ICD-10-CM

## 2022-06-15 DIAGNOSIS — F419 Anxiety disorder, unspecified: Secondary | ICD-10-CM

## 2022-06-15 DIAGNOSIS — E119 Type 2 diabetes mellitus without complications: Secondary | ICD-10-CM

## 2022-06-15 DIAGNOSIS — E1140 Type 2 diabetes mellitus with diabetic neuropathy, unspecified: Secondary | ICD-10-CM

## 2022-06-15 MED ORDER — OZEMPIC 2 MG/DOSE (8 MG/3 ML) SC PNIJ
2 mg | SUBCUTANEOUS | 3 refills | Status: AC
Start: 2022-06-15 — End: ?

## 2022-06-15 NOTE — Progress Notes
Date of Service: 06/15/2022     Subjective:             Jeremy Bullock is a 61 y.o. male.    Diabetes      Pt presents to clinic for management of T2 DM.  Last seen >1 year ago March 2022 with Baldo Ash, APRN. This is pt's first visit with me.     He is accompanied by wife today who also provides insight.     Interval changes:     Tolerating ozempic  and has diarrhea if taking more than 1g of metformin xr.   Would like to be able to lose wt.   A1C is 6.3 today, was previously 5.6 in 08/2021.     Wt Readings from Last 5 Encounters:   06/15/22 133.8 kg (295 lb)   02/15/22 (!) 136.2 kg (300 lb 3.2 oz)   09/15/21 135.3 kg (298 lb 3.2 oz)   08/26/21 135.6 kg (299 lb)   01/08/21 (!) 136.5 kg (301 lb)     T2 DM:   [id number] in 2017    Current treatment:  metformin XR 500 mg BID at meals  ozempic 1.0 mg weekly    Medication adherent: all of the time     Past treatment: used lantus and novolog while inpt after CABG    SMBG and consistency: daily One touch verio; running 95-155 mg/dl     Hyperglycemia: when he eats more  Hypoglycemia: none.  Felt symptoms when blood sugars in the 90 range especially when he took metformin and did not eat immediately.    Meals per day: 3.  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     Complications of DM:  CAD: yes, CABG in 2017  CVA: no  PVD: no  Amputations: no  Retinopathy: no  Gastropathy: no  Nephropathy: no, due for microalbumin over creatinine ratio 12.12 in 11/2016  Neuropathy: mild     Had COVID vaccines    Eye exam: >1 year   Foot exam: today     Medications:  Statin: yes  ACE-I or ARB: yes  ASA: yes    Medical History:   Diagnosis Date   ? Anxiety    ? Coronary artery disease    ? Depression    ? DM (diabetes mellitus) (HCC)    ? Dyslipidemia 05/22/2009   ? Essential (primary) hypertension 05/22/2009   ? History of tobacco use 05/22/2009   ? Obstructive sleep apnea    ? Sleep apnea 05/22/2009     Surgical History:   Procedure Laterality Date   ? Left Heart Cath With Ventriculogram N/A 01/01/2016    Performed by Cath, Physician at [facility] CATH LAB   ? Coronary Angiography N/A 01/01/2016    Performed by Cath, Physician at [facility] CATH LAB   ? Possible Percutaneous Coronary Intervention N/A 01/01/2016    Performed by Cath, Physician at [facility] CATH LAB   ? CORONARY ARTERY BYPASS GRAFT  01/04/16    three vessel   ? BYPASS GRAFT CORONARY ARTERY X3,  EVH, LIMA N/A 01/04/2016    Performed by Hendricks Milo, MD at [facility] CVOR   ? COLONOSCOPY N/A 01/08/2016    Performed by Everardo All, MD at [facility] ENDO   ? HX HEART CATHETERIZATION       Family History   Problem Relation  Age of Onset   ? High Cholesterol Mother    ? Cancer Mother    ? Pulmonary HTN Mother    ? Diabetes Father    ? Coronary Artery Disease Father    ? Heart Attack Father      Social History     Socioeconomic History   ? Marital status: Married     Spouse name: Kennon Rounds   ? Number of children: 3   ? Years of education: 33   Occupational History   ? Occupation: Maintenance Superintendent     Employer: [employer]   Tobacco Use   ? Smoking status: Never   ? Smokeless tobacco: Former     Quit date: 11/07/1996   Substance and Sexual Activity   ? Alcohol use: Yes     Alcohol/week: 1.0 - 2.0 standard drink of alcohol     Types: 1 - 2 Cans of beer per week     Comment: occasionally   ? Drug use: No   ? Sexual activity: Yes     Partners: Female       Labs:  Glucose, POC   Date/Time Value Ref Range Status   01/08/2021 01:37 PM 116  Final     Cholesterol   Date/Time Value Ref Range Status   02/05/2022 12:00 AM 108  Final     HDL   Date/Time Value Ref Range Status   02/05/2022 12:00 AM 34 (L) >=40 Final     LDL   Date/Time Value Ref Range Status   02/05/2022 12:00 AM 56  Final     Triglycerides   Date/Time Value Ref Range Status   02/05/2022 12:00 AM 90  Final     Non HDL Cholesterol   Date/Time Value Ref Range Status   11/25/2016 11:58 AM 51 MG/DL Final     Comment: Calculated non-HDL Cholesterol (non-HDL-C) indirectly measures LDL-C, Lp(a),   IDL-C, and VLDL-C.  It is a surrogate marker for Apoprotein B.  Goal should be   less than 130 mg/dL.       Potassium   Date/Time Value Ref Range Status   08/19/2021 12:00 AM 4.0 3.5 - 5.1 mmol/L Final     Blood Urea Nitrogen   Date/Time Value Ref Range Status   08/19/2021 12:00 AM 13.0 8.4 - 25.7 mg/dL Final     Creatinine   Date/Time Value Ref Range Status   08/19/2021 12:00 AM 1.06 0.72 - 1.25 mg/dL Final     AST (SGOT)   Date/Time Value Ref Range Status   08/19/2021 12:00 AM 18 5 - 34 U/L Final     ALT (SGPT)   Date/Time Value Ref Range Status   02/05/2022 12:00 AM 43  Final     TSH   Date/Time Value Ref Range Status   08/19/2021 12:00 AM 1.23 0.35 - 4.94 uIU/mL Final     Vitamin D(25-OH)Total   Date/Time Value Ref Range Status   11/25/2016 11:58 AM 30.9 30 - 80 NG/ML Final          Review of Systems   Gastrointestinal: Positive for diarrhea.   All other systems reviewed and are negative.        Objective:         ? ascorbic acid (VITAMIN C PO) Take 1 tablet by mouth daily.   ? aspirin EC 81 mg tablet Take 1 Tab by mouth daily. Take with food.   ? atorvastatin (LIPITOR) 40 mg tablet TAKE 1 TABLET DAILY   ?  finasteride (PROSCAR) 5 mg tablet Take one tablet by mouth daily.   ? hydroCHLOROthiazide (HYDRODIURIL) 25 mg tablet TAKE 1 TABLET DAILY   ? lancets 33 gauge (ONETOUCH DELICA 33 GUAGE) 33 gauge Use one each as directed twice daily as needed. E11.65  Collaborating MD:  Dr. Henrine Screws   ? metFORMIN-XR (GLUCOPHAGE XR) 500 mg extended release tablet TAKE 1 TABLET IN THE MORNING WITH BREAKFAST AND 2 TABLETS IN THE EVENING WITH DINNER. MUST KEEP APPOINTMENT FOR FURTHER REFILLS   ? ONETOUCH VERIO TEST STRIPS test strip USE 1 STRIP TWICE A DAY BEFORE MEALS AS DIRECTED   ? semaglutide (OZEMPIC) 1 mg/dose (4 mg/3 mL) injection PEN Inject one mg under the skin every 7 days. 0.75 mL = 1 mg   ? sertraline (ZOLOFT) 50 mg tablet TAKE ONE TABLET BY MOUTH ONCE DAILY   ? tamsulosin (FLOMAX) 0.4 mg capsule Take one capsule by mouth at bedtime daily.   ? valsartan (DIOVAN) 160 mg tablet TAKE 1 TABLET DAILY   ? verapamil  SR (CALAN-SR) 180 mg tablet Take one tablet by mouth daily.     Vitals:    06/15/22 1546   BP: 121/59   BP Source: Arm, Left Upper   Pulse: 54   Temp: 37 ?C (98.6 ?F)   PainSc: Zero   Weight: 133.8 kg (295 lb)   Height: 185.4 cm (6' 1)     Body mass index is 38.92 kg/m?Marland Kitchen     Wt Readings from Last 5 Encounters:   06/15/22 133.8 kg (295 lb)   02/15/22 (!) 136.2 kg (300 lb 3.2 oz)   09/15/21 135.3 kg (298 lb 3.2 oz)   08/26/21 135.6 kg (299 lb)   01/08/21 (!) 136.5 kg (301 lb)     Physical Exam  Constitutional:       Appearance: He is well-developed. He is obese.   HENT:      Head: Normocephalic and atraumatic.   Neck:      Thyroid: No thyromegaly.   Pulmonary:      Effort: Pulmonary effort is normal. No respiratory distress.   Musculoskeletal:      Cervical back: Normal range of motion.   Skin:     General: Skin is warm and dry.   Neurological:      Mental Status: He is alert and oriented to person, place, and time.   Psychiatric:         Behavior: Behavior normal.       Diabetic Foot Exam       Bilateral vascular, sensation, integument are normal:  Yes except 4/5 monofilament on right foot; slightly decreased sensation on outer right foot; 5/5 on left foot       Assessment and Plan:  1.T2 DM,  controlled but some post prandial hyperglycemia.    A1C is 6.3 now, target less than 7.   Peripheral neuropathy   Continue metformin XR 500 mg BID with food  Increase Ozempic to 2 mg weekly.    Advised smaller portion sizes  Continue exercise and healthy eating    Check glucose weekly and may alternate between HS or fasting     Neuropathy: new finding-- very mild. Foot exam completed today. Encouraged optimizing DM management. Discussed foot care. Continue to monitor.     2. Obesity  Discussed patient's BMI with him.  The body mass index is 38.92 kg/m?Marland Kitchen and falls within the category of Obesity 2; BMI plan is in progress and counseled regarding weight loss.  Increase GLP-1 as tolerated.   If on max doses of Ozempic and not having weight loss consider switching to [facility]   Encouraged TLC     3. Hypertension at goal less than 130/80.  BP Readings from Last 1 Encounters:   06/15/22 121/59   follows with Dr. Harlow Asa with cardiology    Continue hCTZ, valsartan, verapamil     4. Dyslipidemia   Lab Results   Component Value Date    CHOL 108 02/05/2022    TRIG 90 02/05/2022    HDL 34 (L) 02/05/2022    LDL 56 02/05/2022    VLDL 18 02/05/2022    NONHDLCHOL 51 11/25/2016    CHOLHDLC 3 02/05/2022      Continue lipitor 40 mg every other day--reduced from daily due to knee pain.   Encouraged TLC     5.  Diabetes microvascular complications:  no history of retinopathy and due for MACR was normal Oct 2022   Check annual labs, will do this locally  Provided order    RTC in 6 months     Total time:  30 min with 20 min counseling about new med

## 2022-09-07 ENCOUNTER — Encounter: Admit: 2022-09-07 | Discharge: 2022-09-07 | Payer: BC Managed Care – PPO

## 2022-09-07 DIAGNOSIS — E119 Type 2 diabetes mellitus without complications: Secondary | ICD-10-CM

## 2022-09-07 MED ORDER — ONETOUCH VERIO TEST STRIPS MISC STRP
ORAL_STRIP | 2 refills | 30.00000 days | Status: AC
Start: 2022-09-07 — End: ?

## 2022-09-07 MED ORDER — METFORMIN 500 MG PO TB24
ORAL_TABLET | 2 refills | Status: AC
Start: 2022-09-07 — End: ?

## 2022-10-31 ENCOUNTER — Encounter: Admit: 2022-10-31 | Discharge: 2022-10-31 | Payer: BC Managed Care – PPO

## 2022-10-31 MED ORDER — HYDROCHLOROTHIAZIDE 25 MG PO TAB
ORAL_TABLET | 3 refills
Start: 2022-10-31 — End: ?

## 2022-12-12 ENCOUNTER — Encounter: Admit: 2022-12-12 | Discharge: 2022-12-12 | Payer: BC Managed Care – PPO

## 2022-12-12 DIAGNOSIS — E1140 Type 2 diabetes mellitus with diabetic neuropathy, unspecified: Secondary | ICD-10-CM

## 2022-12-12 LAB — COMPREHENSIVE METABOLIC PANEL
ALBUMIN: 4.1 g/dL (ref 3.5–5.0)
ALK PHOSPHATASE: 104 U/L (ref 40–150)
ALT: 19 U/L (ref 0–55)
ANION GAP: 9 meq/L (ref 8–16)
AST: 13 U/L (ref 5–34)
BLD UREA NITROGEN: 15 mg/dL (ref 8.4–25.7)
CALCIUM: 9 mg/dL (ref 8.4–10.2)
CO2: 26 mmol/L (ref 22–29)
CREATININE: 0.9 mg/dL (ref 0.72–1.25)
GFR ESTIMATED: 89 mL/min/1.73 (ref >59)
GLUCOSE,PANEL: 95 mg/dL (ref 70–105)
SODIUM: 140 mmol/L (ref 136–145)
TOTAL BILIRUBIN: 0.4 mg/dL (ref 0.2–1.2)
TOTAL PROTEIN: 6.7 g/dL (ref 6.4–8.3)

## 2022-12-12 LAB — MICROALB/CR RATIO-URINE RANDOM
MICROALBUMIN, RAN: 22 mg/dL
MICROALBUMIN/CR RATIO URINE: 7.1 (ref <30.0)
UR CREATININE, RAN: 308 mg/dL (ref 63–166)

## 2022-12-12 LAB — TSH WITH FREE T4 REFLEX: TSH: 1.6 m[IU]/mL (ref 0.35–4.94)

## 2022-12-12 LAB — PLATELET COUNT: PLATELET COUNT: 174 x10-3/uL (ref 163–337)

## 2022-12-15 ENCOUNTER — Encounter: Admit: 2022-12-15 | Discharge: 2022-12-15 | Payer: BC Managed Care – PPO

## 2022-12-15 ENCOUNTER — Ambulatory Visit: Admit: 2022-12-15 | Discharge: 2022-12-16 | Payer: BC Managed Care – PPO

## 2022-12-15 DIAGNOSIS — F419 Anxiety disorder, unspecified: Secondary | ICD-10-CM

## 2022-12-15 DIAGNOSIS — E119 Type 2 diabetes mellitus without complications: Secondary | ICD-10-CM

## 2022-12-15 DIAGNOSIS — I2510 Atherosclerotic heart disease of native coronary artery without angina pectoris: Secondary | ICD-10-CM

## 2022-12-15 DIAGNOSIS — F32A Depression: Secondary | ICD-10-CM

## 2022-12-15 DIAGNOSIS — Z87891 Personal history of nicotine dependence: Secondary | ICD-10-CM

## 2022-12-15 DIAGNOSIS — E1140 Type 2 diabetes mellitus with diabetic neuropathy, unspecified: Secondary | ICD-10-CM

## 2022-12-15 DIAGNOSIS — G4733 Obstructive sleep apnea (adult) (pediatric): Secondary | ICD-10-CM

## 2022-12-15 DIAGNOSIS — I10 Essential (primary) hypertension: Secondary | ICD-10-CM

## 2022-12-15 DIAGNOSIS — E785 Hyperlipidemia, unspecified: Secondary | ICD-10-CM

## 2022-12-15 DIAGNOSIS — G4730 Sleep apnea, unspecified: Secondary | ICD-10-CM

## 2022-12-15 MED ORDER — METFORMIN 500 MG PO TB24
ORAL_TABLET | 3 refills | Status: AC
Start: 2022-12-15 — End: ?

## 2022-12-15 NOTE — Progress Notes
Date of Service: 12/15/2022     Subjective:             Jeremy Bullock is a 62 y.o. male.    Diabetes      Pt presents to clinic for management of T2 DM.  Last seen with me in August 2023 and >1 year ago March 2022 with Baldo Ash, APRN. This is pt's second visit with me.     He is accompanied by wife today who also provides insight.     Interval changes:     Tolerating ozempic  and has diarrhea if taking more than 1g of metformin xr.   Would like to be able to lose wt.     Wt Readings from Last 5 Encounters:   12/15/22 130.6 kg (288 lb)   06/15/22 133.8 kg (295 lb)   02/15/22 (!) 136.2 kg (300 lb 3.2 oz)   09/15/21 135.3 kg (298 lb 3.2 oz)   08/26/21 135.6 kg (299 lb)     A1C is 6.1% today; 6.3% in August 2023 , was previously 5.6 in 08/2021.     T2 DM:   [id number] in 2017    Current treatment:  metformin XR 500 mg BID at meals  ozempic 2 mg weekly    Medication adherent: all of the time     Past treatment: used lantus and novolog while inpt after CABG    SMBG and consistency: daily One touch verio; running 100-130 with average BG 118 mg/dl     Hyperglycemia: when he eats more  Hypoglycemia: none.  Felt symptoms when blood sugars in the 90 range especially when he took metformin and did not eat immediately.    Meals per day: 3.  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     Complications of DM:  CAD: yes, CABG in 2017  CVA: no  PVD: no  Amputations: no  Retinopathy: no  Gastropathy: no  Nephropathy: no, due for microalbumin over creatinine ratio 12.12 in 11/2016  Neuropathy: mild     Had COVID vaccines    Eye exam: October 2023   Foot exam: today     Medications:  Statin: yes  ACE-I or ARB: yes  ASA: yes    Medical History:   Diagnosis Date    Anxiety     Coronary artery disease     Depression     DM (diabetes mellitus) (HCC)     Dyslipidemia 05/22/2009    Essential (primary) hypertension 05/22/2009    History of tobacco use 05/22/2009    Obstructive sleep apnea     Sleep apnea 05/22/2009     Surgical History:   Procedure Laterality Date    Left Heart Cath With Ventriculogram N/A 01/01/2016    Performed by Cath, Physician at [facility] CATH LAB    Coronary Angiography N/A 01/01/2016    Performed by Cath, Physician at [facility] CATH LAB    Possible Percutaneous Coronary Intervention N/A 01/01/2016    Performed by Cath, Physician at [facility] CATH LAB    CORONARY ARTERY BYPASS GRAFT  01/04/16    three vessel    BYPASS GRAFT CORONARY ARTERY X3,  EVH, LIMA N/A 01/04/2016    Performed by Hendricks Milo, MD at [facility] CVOR    COLONOSCOPY N/A 01/08/2016    Performed by Everardo All, MD at [facility] ENDO  HX HEART CATHETERIZATION       Family History   Problem Relation Age of Onset    High Cholesterol Mother     Cancer Mother     Pulmonary HTN Mother     Diabetes Father     Coronary Artery Disease Father     Heart Attack Father      Social History     Socioeconomic History    Marital status: Married     Spouse name: Kennon Rounds    Number of children: 3    Years of education: 14   Occupational History    Occupation: Maintenance Superintendent     Employer: [employer]   Tobacco Use    Smoking status: Never    Smokeless tobacco: Former     Quit date: 11/07/1996   Substance and Sexual Activity    Alcohol use: Yes     Alcohol/week: 1.0 - 2.0 standard drink of alcohol     Types: 1 - 2 Cans of beer per week     Comment: occasionally    Drug use: No    Sexual activity: Yes     Partners: Female       Labs:  Glucose, POC   Date/Time Value Ref Range Status   06/15/2022 12:00 AM 102  Final     Cholesterol   Date/Time Value Ref Range Status   02/05/2022 12:00 AM 108  Final     HDL   Date/Time Value Ref Range Status   02/05/2022 12:00 AM 34 (L) >=40 Final     LDL   Date/Time Value Ref Range Status   02/05/2022 12:00 AM 56  Final     Triglycerides   Date/Time Value Ref Range Status   02/05/2022 12:00 AM 90  Final     Non HDL Cholesterol   Date/Time Value Ref Range Status   11/25/2016 11:58 AM 51 MG/DL Final     Comment:     Calculated non-HDL Cholesterol (non-HDL-C) indirectly measures LDL-C, Lp(a),   IDL-C, and VLDL-C.  It is a surrogate marker for Apoprotein B.  Goal should be   less than 130 mg/dL.       Potassium   Date/Time Value Ref Range Status   12/12/2022 12:00 AM 4.0 3.5 - 5.1 mmol/L Final     Blood Urea Nitrogen   Date/Time Value Ref Range Status   12/12/2022 12:00 AM 15.0 8.4 - 25.7 mg/dL Final     Creatinine   Date/Time Value Ref Range Status   12/12/2022 12:00 AM 0.92 0.72 - 1.25 mg/dL Final     AST (SGOT)   Date/Time Value Ref Range Status   12/12/2022 12:00 AM 13 5 - 34 U/L Final     ALT (SGPT)   Date/Time Value Ref Range Status   12/12/2022 12:00 AM 19 0 - 55 U/L Final     TSH   Date/Time Value Ref Range Status   12/12/2022 12:00 AM 1.61 0.35 - 4.94 MIU/mL Final     Vitamin D(25-OH)Total   Date/Time Value Ref Range Status   11/25/2016 11:58 AM 30.9 30 - 80 NG/ML Final        Review of Systems   Gastrointestinal:  Positive for constipation, diarrhea and nausea.        Intermittently with GLP1   All other systems reviewed and are negative.      Objective:          ascorbic acid (VITAMIN C PO) Take 1 tablet by mouth  daily.    aspirin EC 81 mg tablet Take 1 Tab by mouth daily. Take with food.    atorvastatin (LIPITOR) 40 mg tablet TAKE 1 TABLET DAILY    finasteride (PROSCAR) 5 mg tablet Take one tablet by mouth daily.    hydroCHLOROthiazide (HYDRODIURIL) 25 mg tablet TAKE 1 TABLET DAILY    lancets 33 gauge (ONETOUCH DELICA 33 GUAGE) 33 gauge Use one each as directed twice daily as needed. E11.65  Collaborating MD:  Dr. Henrine Screws    metFORMIN-XR (GLUCOPHAGE XR) 500 mg extended release tablet TAKE 1 TABLET IN THE MORNING WITH BREAKFAST AND 2 TABLETS IN THE EVENING WITH DINNER (MUST KEEP APPOINTMENT FOR FURTHER REFILLS)    ONETOUCH VERIO TEST STRIPS test strip USE 1 STRIP TWICE A DAY BEFORE MEALS AS DIRECTED    semaglutide (OZEMPIC) 2 mg/dose (8 mg/3 mL) injection PEN Inject two mg under the skin every 7 days.    sertraline (ZOLOFT) 50 mg tablet TAKE ONE TABLET BY MOUTH ONCE DAILY    tamsulosin (FLOMAX) 0.4 mg capsule Take one capsule by mouth at bedtime daily.    valsartan (DIOVAN) 160 mg tablet TAKE 1 TABLET DAILY    verapamil  SR (CALAN-SR) 180 mg tablet Take one tablet by mouth daily.     Vitals:    12/15/22 0906   BP: 136/69   Pulse: 77   Temp: 36.6 ?C (97.8 ?F)   PainSc: Zero   Weight: 130.6 kg (288 lb)   Height: 185.4 cm (6' 1)     Body mass index is 38 kg/m?Marland Kitchen     Wt Readings from Last 5 Encounters:   12/15/22 130.6 kg (288 lb)   06/15/22 133.8 kg (295 lb)   02/15/22 (!) 136.2 kg (300 lb 3.2 oz)   09/15/21 135.3 kg (298 lb 3.2 oz)   08/26/21 135.6 kg (299 lb)     Physical Exam  Constitutional:       Appearance: He is well-developed. He is obese.   HENT:      Head: Normocephalic and atraumatic.   Neck:      Thyroid: No thyromegaly.   Pulmonary:      Effort: Pulmonary effort is normal. No respiratory distress.   Musculoskeletal:      Cervical back: Normal range of motion.   Skin:     General: Skin is warm and dry.   Neurological:      Mental Status: He is alert and oriented to person, place, and time.   Psychiatric:         Behavior: Behavior normal.       Diabetic Foot Exam       Bilateral vascular, sensation, integument are normal:  Yes except 4/5 monofilament on right foot; slightly decreased sensation on outer right foot; 5/5 on left foot       Assessment and Plan:  1.T2 DM,  controlled but some post prandial hyperglycemia.   A1C is 6.1% now, target less than 7.   Peripheral neuropathy   Continue metformin XR 500 mg BID with food-- did not tolerate higher doses   Continue Ozempic to 2 mg weekly.   Advised smaller portion sizes  Continue exercise and healthy eating    Can consider switching to [facility] if needed for weight loss or glycemic improvement   Annual labs UtD     Check glucose weekly and may alternate between HS or fasting     Neuropathy: stable.     2. Obesity  Discussed patient's BMI  with him.  The body mass index is 38 kg/m?Marland Kitchen and falls within the category of Obesity 2; BMI plan is in progress and counseled regarding weight loss.  Increase GLP-1 as tolerated.   If on max doses of Ozempic and not having weight loss consider switching to [facility]   Encouraged TLC     3. Hypertension at goal less than 130/80.  BP Readings from Last 1 Encounters:   12/15/22 136/69   follows with Dr. Harlow Asa with cardiology    Continue hCTZ, valsartan, verapamil     4. Dyslipidemia   Lab Results   Component Value Date    CHOL 108 02/05/2022    TRIG 90 02/05/2022    HDL 34 (L) 02/05/2022    LDL 56 02/05/2022    VLDL 18 02/05/2022    NONHDLCHOL 51 11/25/2016    CHOLHDLC 3 02/05/2022      Continue lipitor 40 mg every other day--reduced from daily due to knee pain.   Encouraged TLC     5.  Diabetes microvascular complications:  no history of retinopathy and due for MACR was normal Oct 2022   Annual labs UTD   Provided order    RTC in 6-12 months     Total time:  30 min with 20 min counseling about new med

## 2022-12-16 DIAGNOSIS — E119 Type 2 diabetes mellitus without complications: Secondary | ICD-10-CM

## 2023-02-28 ENCOUNTER — Encounter: Admit: 2023-02-28 | Discharge: 2023-02-28 | Payer: BC Managed Care – PPO

## 2023-02-28 MED ORDER — VALSARTAN 160 MG PO TAB
ORAL_TABLET | 3 refills
Start: 2023-02-28 — End: ?

## 2023-03-15 ENCOUNTER — Encounter: Admit: 2023-03-15 | Discharge: 2023-03-15 | Payer: BC Managed Care – PPO

## 2023-03-15 MED ORDER — ATORVASTATIN 40 MG PO TAB
ORAL_TABLET | 3 refills
Start: 2023-03-15 — End: ?

## 2023-04-27 ENCOUNTER — Encounter: Admit: 2023-04-27 | Discharge: 2023-04-27 | Payer: BC Managed Care – PPO

## 2023-04-27 DIAGNOSIS — E1140 Type 2 diabetes mellitus with diabetic neuropathy, unspecified: Secondary | ICD-10-CM

## 2023-04-27 MED ORDER — OZEMPIC 2 MG/DOSE (8 MG/3 ML) SC PNIJ
3 refills | Status: AC
Start: 2023-04-27 — End: ?

## 2023-04-28 ENCOUNTER — Encounter: Admit: 2023-04-28 | Discharge: 2023-04-28 | Payer: BC Managed Care – PPO

## 2023-04-28 NOTE — Telephone Encounter
Received paperwork for Ozempic PA  Filled out and routing to [facility] for signature

## 2023-05-30 ENCOUNTER — Encounter: Admit: 2023-05-30 | Discharge: 2023-05-30 | Payer: BC Managed Care – PPO

## 2023-05-30 DIAGNOSIS — E119 Type 2 diabetes mellitus without complications: Secondary | ICD-10-CM

## 2023-05-30 MED ORDER — ONETOUCH VERIO TEST STRIPS MISC STRP
ORAL_STRIP | 1 refills | 30.00000 days | Status: AC
Start: 2023-05-30 — End: ?

## 2023-06-07 ENCOUNTER — Encounter: Admit: 2023-06-07 | Discharge: 2023-06-07 | Payer: BC Managed Care – PPO

## 2023-06-19 ENCOUNTER — Encounter: Admit: 2023-06-19 | Discharge: 2023-06-19 | Payer: BC Managed Care – PPO

## 2023-06-29 ENCOUNTER — Encounter: Admit: 2023-06-29 | Discharge: 2023-06-29 | Payer: BC Managed Care – PPO

## 2023-06-29 DIAGNOSIS — I2510 Atherosclerotic heart disease of native coronary artery without angina pectoris: Secondary | ICD-10-CM

## 2023-06-29 DIAGNOSIS — G4730 Sleep apnea, unspecified: Secondary | ICD-10-CM

## 2023-06-29 DIAGNOSIS — E1169 Type 2 diabetes mellitus with other specified complication: Secondary | ICD-10-CM

## 2023-06-29 DIAGNOSIS — Z87891 Personal history of nicotine dependence: Secondary | ICD-10-CM

## 2023-06-29 DIAGNOSIS — G4733 Obstructive sleep apnea (adult) (pediatric): Secondary | ICD-10-CM

## 2023-06-29 DIAGNOSIS — I10 Essential (primary) hypertension: Secondary | ICD-10-CM

## 2023-06-29 DIAGNOSIS — Z136 Encounter for screening for cardiovascular disorders: Secondary | ICD-10-CM

## 2023-06-29 DIAGNOSIS — E119 Type 2 diabetes mellitus without complications: Secondary | ICD-10-CM

## 2023-06-29 DIAGNOSIS — F32A Depression: Secondary | ICD-10-CM

## 2023-06-29 DIAGNOSIS — E785 Hyperlipidemia, unspecified: Secondary | ICD-10-CM

## 2023-06-29 DIAGNOSIS — F419 Anxiety disorder, unspecified: Secondary | ICD-10-CM

## 2023-06-29 NOTE — Progress Notes
Date of Service: 06/29/2023    Aloise Shellman Lamarius Curro is a 62 y.o. male.       HPI   CAMERON CHREST is followed for coronary artery disease, hypertension and hypercholesterolemia.  He is also followed for type 2 diabetes mellitus. His diverticulitis appears to be under good control.   He has lost another 11 pounds since last time that I saw him.  He reports that endocrinology is thinking about switching him from Ozempic to [facility] to help encourage additional weight loss. 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  Mr. Vandeberg has noticed just a little more fatigue over the past year.  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  Otherwise, over the past 6 months, the patient has been doing well and reports no angina, congestive symptoms, palpitations, sensation of sustained forceful heart pounding, lightheadedness or syncope.  His exercise tolerance has been stable, although he does not have a regular exercise routine.  He is active at work but does not have a regular exercise routine.  He does have an elliptical and stationary bicycle at home that he does not use.  He states that he usually gets 8000 steps per day. The patient reports no claudication, myalgias, bleeding abnormalities, neurologic motor abnormalities or difficulty with speech.  Historically, I saw Mr. Wick back on 12/31/2015.  At that time he reported exertional chest discomfort suggestive for angina pectoris.  [place] angiography was performed on 01/01/2016 and revealed severe three-vessel coronary disease.  On 01/04/2016, he then underwent coronary bypass grafting times 3 with left internal mammary artery to LAD, reverse saphenous vein graft to OM1, and reverse saphenous vein graft to D2.   Mr. Streich reports having COVID in December 2021.  He was pretty sick over 3 weeks with this COVID infection.  He then had COVID a second time in August 2022 but was symptomatic for only 2 days.         Vitals:    06/29/23 0949   BP: (!) 142/87   BP Source: Arm, Left Upper   Pulse: 57   SpO2: 98%   O2 Device: None (Room air)   PainSc: Three   Weight: 131.3 kg (289 lb 6.4 oz)   Height: 185.4 cm (6' 1)     Body mass index is 38.18 kg/m?Marland Kitchen     Past Medical History  Patient Active Problem List    Diagnosis Date Noted    Abnormal chest x-ray 07/13/2017    Coronary artery disease due to calcified coronary lesion 02/11/2016     01/2016 - CABG      Type 2 diabetes mellitus (HCC) 02/19/2015     07/2014 - Fasting glucose 162.  Hgb A1c 7.4%        Morbid obesity (HCC) 08/02/2011    Essential (primary) hypertension 05/22/2009    History of tobacco use 05/22/2009    Dyslipidemia 05/22/2009     borderline      Sleep apnea 05/22/2009     2004 - Diagnosis by sleep study at [facility], ordered by Dr. Gwenette Greet.  CPAP started.    09/2012 - Office consultation with Dr. Lucilla Lame.  Follow-up sleep study scheduled for 10/2012.           Review of Systems   Constitutional: Positive for malaise/fatigue.   HENT:  Positive for congestion.    Eyes: Negative.  Cardiovascular:  Positive for dyspnea on exertion.   Respiratory:  Positive for cough and sputum production.    Endocrine: Negative.    Hematologic/Lymphatic: Negative.    Skin: Negative.    Musculoskeletal:  Positive for back pain and muscle cramps.   Gastrointestinal: Negative.    Genitourinary: Negative.    Psychiatric/Behavioral: Negative.     Allergic/Immunologic: Negative.        Physical Exam  GENERAL: The patient is well developed, overweight, well nourished, resting comfortably and in no distress.   HEENT: No abnormalities of the visible oro-nasopharynx, conjunctiva or sclera are noted.  NECK: There is no jugular venous distension. Carotids are palpable and without bruits. There is no thyroid enlargement.  Chest: Lung fields are clear to auscultation. There are no wheezes or crackles.  CV: There is a regular rhythm. The first and second heart sounds are normal. There are no murmurs, gallops or rubs.  His apical heart rate is 60 bpm.  ABD: The abdomen is soft and supple with normal bowel sounds. There is no hepatosplenomegaly, ascites, tenderness, masses or bruits.  Neuro: There are no focal motor defects. Ambulation is normal. Cognitive function appears normal.  Ext: There is no edema or evidence of deep vein thrombosis. Peripheral pulses are satisfactory.    SKIN: There are no rashes and no cellulitis  PSYCH: The patient is calm, rationale and oriented    Cardiovascular Studies  A twelve-lead ECG obtained on 06/29/2023 reveals normal sinus rhythm with a heart rate of 59 bpm.  Premature atrial beats are seen.  Left ventricular hypertrophy is noted with secondary ST-T wave abnormalities.  Echo Doppler 09/15/2021:  Interpretation Summary     The left ventricular size is normal. Concentric remodeling. The left ventricular systolic function is hyperdynamic. The visually estimated ejection fraction is 70%. There are no segmental wall motion abnormalities.  The right ventricle is probably normal in size. The right ventricular systolic function is normal.  Normal biatrial size.  No significant valvular disease.  The pulmonary artery pressure could not be estimated due to inadequate tricuspid regurgitation signal.  No pericardial effusion.     Echocardiographic Findings     Left Ventricle The left ventricular size is normal. Concentric remodeling. The left ventricular systolic function is hyperdynamic. The visually estimated ejection fraction is 70%. There are no segmental wall motion abnormalities. Normal left ventricular diastolic function. Normal left atrial pressure.   Right Ventricle The right ventricle is probably normal in size. The right ventricular systolic function is normal. The pulmonary artery pressure could not be estimated due to inadequate tricuspid regurgitation signal.   Left Atrium Normal size.   Right Atrium Normal size.   IVC/SVC Normal central venous pressure (0-5 mm Hg).   Mitral Valve Normal valve structure. No stenosis. Trace regurgitation.   Tricuspid Valve Normal valve structure. No stenosis. No regurgitation.   Aortic Valve Probably tricuspid aortic valve present.  No stenosis. No regurgitation.   Pulmonary Normal valve structure. No stenosis. No regurgitation.   Aorta The aortic root and ascending aorta are normal in size.   Pericardium No pericardial effusion.     Cardiovascular Health Factors  Vitals BP Readings from Last 3 Encounters:   06/29/23 (!) 142/87   06/07/23 121/69   12/15/22 136/69     Wt Readings from Last 3 Encounters:   06/29/23 131.3 kg (289 lb 6.4 oz)   06/07/23 128.4 kg (283 lb)   12/15/22 130.6 kg (288 lb)     BMI Readings from  Last 3 Encounters:   06/29/23 38.18 kg/m?   06/07/23 37.34 kg/m?   12/15/22 38.00 kg/m?      Smoking Social History     Tobacco Use   Smoking Status Never   Smokeless Tobacco Former    Quit date: 11/07/1996      Lipid Profile Cholesterol   Date Value Ref Range Status   02/05/2022 108  Final     HDL   Date Value Ref Range Status   02/05/2022 34 (L) >=40 Final     LDL   Date Value Ref Range Status   02/05/2022 56  Final     Triglycerides   Date Value Ref Range Status   02/05/2022 90  Final      Blood Sugar Hemoglobin A1C   Date Value Ref Range Status   08/19/2021 5.6  Final     Glucose   Date Value Ref Range Status   12/12/2022 95 70 - 105 mg/dL Final   91/47/8295 621 70 - 105 mg/dL Final   30/86/5784 696 (H) 70 - 105 mg/dL Final     Glucose, POC   Date Value Ref Range Status   06/07/2023 166 (A) 70 - 100 Final   12/15/2022 114 (A) 70 - 100 Final   06/15/2022 102  Final          Problems Addressed Today  Encounter Diagnoses   Name Primary?    Screening for heart disease Yes    Essential (primary) hypertension     Coronary artery disease due to calcified coronary lesion     Dyslipidemia     Type 2 diabetes mellitus with other specified complication, unspecified whether long term insulin use (HCC)        Assessment and Plan   Mr. Ander appears stable from a cardiovascular perspective.  I do not believe the fatigue that he is experiencing is due to left ventricular systolic dysfunction.  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  However, I did ask him to obtain an echo Doppler study just to assess for any structural cardiac abnormalities.  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  I congratulated him on his 11 pound weight loss since last time I saw him.  His LDL cholesterol appears at goal.  I have asked him to continue working with his primary care physician for control of his hypertension and to continue working with endocrinology for control of his diabetes mellitus.  Cardiovascular risk factor modification was discussed in great detail. Regular mild aerobic exercise, weight loss and adherence to a heart healthy diet were recommended.  I have asked him to return for follow-up in 6 months time. The total time spent during this interview and exam with preparation and chart review was 30 minutes.         Current Medications (including today's revisions)   acetaminophen (TYLENOL EXTRA STRENGTH) 500 mg tablet Take two tablets by mouth as Needed for Pain. Max of 4,000 mg of acetaminophen in 24 hours.    ascorbic acid (VITAMIN C PO) Take 1 tablet by mouth daily.    aspirin EC 81 mg tablet Take 1 Tab by mouth daily. Take with food.    atorvastatin (LIPITOR) 40 mg tablet TAKE 1 TABLET DAILY    blood sugar diagnostic (ONETOUCH VERIO TEST STRIPS) test strip USE  TWICE A DAY BEFORE MEALS AS DIRECTED    finasteride (PROSCAR)  5 mg tablet Take one tablet by mouth daily.    hydroCHLOROthiazide (HYDRODIURIL) 25 mg tablet TAKE 1 TABLET DAILY    lancets 33 gauge (ONETOUCH DELICA 33 GUAGE) 33 gauge Use one each as directed twice daily as needed. E11.65  Collaborating MD:  Dr. Henrine Screws meloxicam [facility]) 15 mg tablet Take one tablet by mouth as Needed.    metFORMIN-XR (GLUCOPHAGE XR) 500 mg extended release tablet TAKE 1 TABLET IN THE MORNING WITH BREAKFAST AND 2 TABLETS IN THE EVENING WITH DINNER (MUST KEEP APPOINTMENT FOR FURTHER REFILLS)  Indications: type 2 diabetes mellitus (Patient taking differently: TAKE 0.5 TABLET IN THE MORNING WITH BREAKFAST AND 1 TABLETS IN THE EVENING WITH DINNER (MUST KEEP APPOINTMENT FOR FURTHER REFILLS)  Indications: type 2 diabetes mellitus)    semaglutide (OZEMPIC) 2 mg/dose (8 mg/3 mL) injection PEN Inject two mg under the skin every 7 days.    sertraline (ZOLOFT) 50 mg tablet TAKE ONE TABLET BY MOUTH ONCE DAILY    tamsulosin (FLOMAX) 0.4 mg capsule Take one capsule by mouth at bedtime daily.    tirzepatide (MOUNJARO) 7.5 mg/0.5 mL injector PEN Inject 0.5 mL under the skin every 7 days.    valsartan (DIOVAN) 160 mg tablet TAKE 1 TABLET DAILY    verapamil CR (VERELAN) 180 mg capsule Take one capsule by mouth daily.

## 2023-06-29 NOTE — Patient Instructions
Thank you for visiting our office today.    We would like to make the following medication adjustments:  NONE       Otherwise continue the same medications as you have been doing.          We will be pursuing the following tests after your appointment today:       Orders Placed This Encounter    ECG 12-LEAD    2D + DOPPLER ECHO         We will plan to see you back in 6 months.  Please call us in the meantime with any questions or concerns.        Please allow 5-7 business days for our providers to review your results. All normal results will go to MyChart. If you do not have Mychart, it is strongly recommended to get this so you can easily view all your results. If you do not have mychart, we will attempt to call you once with normal lab and testing results. If we cannot reach you by phone with normal results, we will send you a letter.  If you have not heard the results of your testing after one week please give [place] a call.       Your Cardiovascular Medicine [place] [place] [facility] Team Brett Canales, Pilar Jarvis, Shawna Orleans, and [place])  phone number is [phone].

## 2023-07-12 ENCOUNTER — Ambulatory Visit: Admit: 2023-07-12 | Discharge: 2023-07-12 | Payer: BC Managed Care – PPO

## 2023-07-12 ENCOUNTER — Encounter: Admit: 2023-07-12 | Discharge: 2023-07-12 | Payer: BC Managed Care – PPO

## 2023-07-12 DIAGNOSIS — I2510 Atherosclerotic heart disease of native coronary artery without angina pectoris: Secondary | ICD-10-CM

## 2023-07-12 DIAGNOSIS — Z136 Encounter for screening for cardiovascular disorders: Secondary | ICD-10-CM

## 2023-07-12 DIAGNOSIS — E1169 Type 2 diabetes mellitus with other specified complication: Secondary | ICD-10-CM

## 2023-07-12 DIAGNOSIS — E785 Hyperlipidemia, unspecified: Secondary | ICD-10-CM

## 2023-07-12 DIAGNOSIS — I10 Essential (primary) hypertension: Secondary | ICD-10-CM

## 2023-07-12 MED ORDER — PERFLUTREN LIPID MICROSPHERES 1.1 MG/ML IV SUSP
1-10 mL | Freq: Once | INTRAVENOUS | 0 refills | Status: CP | PRN
Start: 2023-07-12 — End: ?

## 2023-07-12 MED ORDER — SODIUM CHLORIDE 0.9 % IJ SOLN
10 mL | Freq: Once | INTRAVENOUS | 0 refills | Status: CP
Start: 2023-07-12 — End: ?

## 2023-07-12 NOTE — Telephone Encounter
Results and recommendations called to patient.

## 2023-07-12 NOTE — Telephone Encounter
-----   Message from Jeremy Blas, MD sent at 07/12/2023 11:46 AM CDT -----  Favorable echo Doppler study.  Only mild aortic valve sclerosis is noted without any significant valve dysfunction.  Normal left ventricular systolic and diastolic function.  Please let him know.  Thanks.  SBG

## 2023-08-28 ENCOUNTER — Encounter: Admit: 2023-08-28 | Discharge: 2023-08-28 | Payer: BC Managed Care – PPO

## 2023-08-28 DIAGNOSIS — E1140 Type 2 diabetes mellitus with diabetic neuropathy, unspecified: Secondary | ICD-10-CM

## 2023-08-28 MED ORDER — METFORMIN 500 MG PO TB24
500 mg | ORAL_TABLET | Freq: Every day | ORAL | 3 refills | Status: AC
Start: 2023-08-28 — End: ?

## 2023-08-28 MED ORDER — VALSARTAN 160 MG PO TAB
ORAL_TABLET | ORAL | 3 refills | 60.00000 days | Status: AC
Start: 2023-08-28 — End: ?

## 2023-10-25 ENCOUNTER — Encounter: Admit: 2023-10-25 | Discharge: 2023-10-25 | Payer: BC Managed Care – PPO

## 2023-10-25 MED ORDER — HYDROCHLOROTHIAZIDE 25 MG PO TAB
ORAL_TABLET | ORAL | 0 refills | 28.00000 days | Status: AC
Start: 2023-10-25 — End: ?

## 2023-11-24 ENCOUNTER — Encounter: Admit: 2023-11-24 | Discharge: 2023-11-24 | Payer: BC Managed Care – PPO

## 2023-11-24 DIAGNOSIS — E119 Type 2 diabetes mellitus without complications: Secondary | ICD-10-CM

## 2023-11-24 MED ORDER — ONETOUCH VERIO TEST STRIPS MISC STRP
ORAL_STRIP | 3 refills | 30.00000 days | Status: AC
Start: 2023-11-24 — End: ?

## 2023-11-29 ENCOUNTER — Encounter: Admit: 2023-11-29 | Discharge: 2023-11-29 | Payer: BC Managed Care – PPO

## 2023-11-29 ENCOUNTER — Ambulatory Visit: Admit: 2023-11-29 | Discharge: 2023-11-30 | Payer: BC Managed Care – PPO

## 2023-11-29 DIAGNOSIS — I10 Essential (primary) hypertension: Secondary | ICD-10-CM

## 2023-11-29 DIAGNOSIS — E785 Hyperlipidemia, unspecified: Secondary | ICD-10-CM

## 2023-11-29 DIAGNOSIS — G4733 Obstructive sleep apnea (adult) (pediatric): Secondary | ICD-10-CM

## 2023-11-29 DIAGNOSIS — E66812 Class 2 severe obesity due to excess calories with serious comorbidity and body mass index (BMI) of 37.0 to 37.9 in adult (HCC): Secondary | ICD-10-CM

## 2023-11-29 MED ORDER — MOUNJARO 7.5 MG/0.5 ML SC PNIJ
7.5 mg | SUBCUTANEOUS | 3 refills | Status: AC
Start: 2023-11-29 — End: ?

## 2023-11-29 NOTE — Progress Notes
Date of Service: 11/29/2023     Subjective:             Jeremy Bullock is a 63 y.o. male.    Diabetes    Pt presents to clinic for management of T2 DM.  Last seen with me in July 2024.     Interval changes:     Switched to [employer] from [employer] last visit due to worsening GI side effects on Ozempic.  These have improved on Mounjaro.   Is dealing with some left ACL strain-- doing PT for this at least 2x/week.     Weight down about 6 lbs from last visit.     Wt Readings from Last 5 Encounters:   11/29/23 128.4 kg (283 lb)   07/12/23 131.1 kg (289 lb)   06/29/23 131.3 kg (289 lb 6.4 oz)   06/07/23 128.4 kg (283 lb)   12/15/22 130.6 kg (288 lb)     A1C is 5.9% today; down from 6.2% August 2024; 6.1% Feb 2024; 6.3% in August 2023 , was previously 5.6 in 08/2021.     Does have c/o fatigue, decreased libido. Wondering if his testosterone levels can be checked.   Also has hx of OSA and is adherent to CPAP.     T2 DM:   [id number] in 2017    Current treatment:  metformin XR 500 mg QD   Mounjaro 7.5 mg weekly     Medication adherent: all of the time     Past treatment:   used lantus and novolog while inpt after CABG  Ozempic 2 mg weekly-- having some nausea     SMBG and consistency: daily One touch verio; running 100-130 with average BG 118 mg/dl     Hyperglycemia: when he eats more  Hypoglycemia: none.  Felt symptoms when blood sugars in the 90 range especially when he took metformin and did not eat immediately.    Meals per day: 3.  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     Complications of DM:  CAD: yes, CABG in 2017  CVA: no  PVD: no  Amputations: no  Retinopathy: no  Gastropathy: no  Nephropathy: no   Neuropathy: mild     Eye exam: June 2024 -- no retinopathy noted but pt reports some spots-- will obtain copy   Foot exam  completed today-- no wounds today     Medications:  Statin: yes  ACE-I or ARB: yes  ASA: yes    Past Medical History:    Anxiety    Coronary artery disease    Depression    DM (diabetes mellitus) (HCC)    Dyslipidemia    Essential (primary) hypertension    History of tobacco use    Obstructive sleep apnea    Sleep apnea     Surgical History:   Procedure Laterality Date    Left Heart Cath With Ventriculogram N/A 01/01/2016    Performed by Cath, Physician at [facility] CATH LAB    Coronary Angiography N/A 01/01/2016    Performed by Cath, Physician at [facility] CATH LAB    Possible Percutaneous Coronary Intervention N/A 01/01/2016    Performed by Cath, Physician at [facility] CATH LAB    CORONARY ARTERY BYPASS GRAFT  01/04/16    three vessel    BYPASS GRAFT CORONARY ARTERY X3,  EVH, LIMA N/A 01/04/2016  Performed by Hendricks Milo, MD at [facility] CVOR    COLONOSCOPY N/A 01/08/2016    Performed by Everardo All, MD at [facility] ENDO    HX HEART CATHETERIZATION       Family History   Problem Relation Name Age of Onset    High Cholesterol Mother      Cancer Mother      Pulmonary HTN Mother      Diabetes Father      Coronary Artery Disease Father      Heart Attack Father       Social History     Socioeconomic History    Marital status: Married     Spouse name: Kennon Rounds    Number of children: 3    Years of education: 14   Occupational History    Occupation: Maintenance Superintendent     Employer: [employer]   Tobacco Use    Smoking status: Never    Smokeless tobacco: Former     Quit date: 11/07/1996   Substance and Sexual Activity    Alcohol use: Yes     Alcohol/week: 1.0 - 2.0 standard drink of alcohol     Types: 1 - 2 Cans of beer per week     Comment: occasionally    Drug use: No    Sexual activity: Yes     Partners: Female     Labs:  Glucose, POC   Date/Time Value Ref Range Status   06/07/2023 09:29 AM 166 (A) 70 - 100 Final     Cholesterol   Date/Time Value Ref Range Status   02/05/2022 12:00 AM 108  Final     HDL   Date/Time Value Ref Range Status   02/05/2022 12:00 AM 34 (L) >=40 Final     LDL   Date/Time Value Ref Range Status 02/05/2022 12:00 AM 56  Final     Triglycerides   Date/Time Value Ref Range Status   02/05/2022 12:00 AM 90  Final     Non HDL Cholesterol   Date/Time Value Ref Range Status   11/25/2016 11:58 AM 51 MG/DL Final     Comment:     Calculated non-HDL Cholesterol (non-HDL-C) indirectly measures LDL-C, Lp(a),   IDL-C, and VLDL-C.  It is a surrogate marker for Apoprotein B.  Goal should be   less than 130 mg/dL.       Potassium   Date/Time Value Ref Range Status   12/12/2022 12:00 AM 4.0 3.5 - 5.1 mmol/L Final     Blood Urea Nitrogen   Date/Time Value Ref Range Status   12/12/2022 12:00 AM 15.0 8.4 - 25.7 mg/dL Final     Creatinine   Date/Time Value Ref Range Status   12/12/2022 12:00 AM 0.92 0.72 - 1.25 mg/dL Final     AST (SGOT)   Date/Time Value Ref Range Status   12/12/2022 12:00 AM 13 5 - 34 U/L Final     ALT (SGPT)   Date/Time Value Ref Range Status   12/12/2022 12:00 AM 19 0 - 55 U/L Final     TSH   Date/Time Value Ref Range Status   12/12/2022 12:00 AM 1.61 0.35 - 4.94 MIU/mL Final     Vitamin D(25-OH)Total   Date/Time Value Ref Range Status   11/25/2016 11:58 AM 30.9 30 - 80 NG/ML Final        Review of Systems   Gastrointestinal:  Positive for constipation, diarrhea and nausea.        Intermittently  with GLP1   All other systems reviewed and are negative.      Objective:          acetaminophen (TYLENOL EXTRA STRENGTH) 500 mg tablet Take two tablets by mouth as Needed for Pain. Max of 4,000 mg of acetaminophen in 24 hours.    ascorbic acid (VITAMIN C PO) Take 1 tablet by mouth daily.    aspirin EC 81 mg tablet Take 1 Tab by mouth daily. Take with food.    atorvastatin (LIPITOR) 40 mg tablet TAKE 1 TABLET DAILY    finasteride (PROSCAR) 5 mg tablet Take one tablet by mouth daily.    hydroCHLOROthiazide (HYDRODIURIL) 25 mg tablet TAKE 1 TABLET DAILY    lancets 33 gauge (ONETOUCH DELICA 33 GUAGE) 33 gauge Use one each as directed twice daily as needed. E11.65  Collaborating MD:  Dr. Henrine Screws    meloxicam [facility]) 15 mg tablet Take one tablet by mouth as Needed.    metFORMIN-XR (GLUCOPHAGE XR) 500 mg extended release tablet Take one tablet by mouth daily with dinner. TAKE AS INSTRUCTED BY YOUR PRESCRIBER  Indications: type 2 diabetes mellitus    ONETOUCH VERIO TEST STRIPS test strip USE TWICE A DAY BEFORE MEALS AS DIRECTED    semaglutide (OZEMPIC) 2 mg/dose (8 mg/3 mL) injection PEN Inject two mg under the skin every 7 days.    sertraline (ZOLOFT) 50 mg tablet TAKE ONE TABLET BY MOUTH ONCE DAILY    tamsulosin (FLOMAX) 0.4 mg capsule Take one capsule by mouth at bedtime daily.    tirzepatide (MOUNJARO) 7.5 mg/0.5 mL injector PEN Inject 0.5 mL under the skin every 7 days.    valsartan (DIOVAN) 160 mg tablet TAKE 1 TABLET DAILY    verapamil CR (VERELAN) 180 mg capsule Take one capsule by mouth daily.     Vitals:    11/29/23 0902   BP: 130/69   Pulse: 64   PainSc: Zero   Weight: 128.4 kg (283 lb)   Height: 185.4 cm (6' 1)     Body mass index is 37.34 kg/m?Marland Kitchen     Wt Readings from Last 5 Encounters:   11/29/23 128.4 kg (283 lb)   07/12/23 131.1 kg (289 lb)   06/29/23 131.3 kg (289 lb 6.4 oz)   06/07/23 128.4 kg (283 lb)   12/15/22 130.6 kg (288 lb)     Physical Exam  Constitutional:       Appearance: He is well-developed. He is obese.   HENT:      Head: Normocephalic and atraumatic.   Neck:      Thyroid: No thyromegaly.   Pulmonary:      Effort: Pulmonary effort is normal. No respiratory distress.   Musculoskeletal:      Cervical back: Normal range of motion.   Skin:     General: Skin is warm and dry.   Neurological:      Mental Status: He is alert and oriented to person, place, and time.   Psychiatric:         Behavior: Behavior normal.       Diabetic Foot Exam       Bilateral vascular, sensation, integument are normal:  Yes    Vascular Status    Left:normal Right:normal   Monofilament Testing    Left:Diminished Right:Diminished   Sensation    Left:pinprick sensation diminished, Right:pinprick sensation diminished,   Skin Integrity    Left:Normal Right:Normal   Foot Structure    Left:Normal Right:Normal  Assessment and Plan:  1.T2 DM,  controlled but some post prandial hyperglycemia.   A1C is 6.2% now, target less than 7.   Peripheral neuropathy     Continue metformin XR 500 mg QD with food-- did not tolerate higher doses   Continue Mounjaro 7.5 mg weekly as A1c at target.    Advised smaller portion sizes  Continue exercise and healthy eating    Annual labs UTD in Feb 2024 -- due now, ordered.     Check glucose weekly and may alternate between HS or fasting     Neuropathy: stable.     2. Obesity  Discussed patient's BMI with him.  The body mass index is 37.34 kg/m?Marland Kitchen and falls within the category of Obesity 2; BMI plan is in progress and counseled regarding weight loss.  Continue Mounjaro 7.5 mg weekly.   Encouraged TLC     3. Hypertension at goal less than 130/80.  BP Readings from Last 1 Encounters:   11/29/23 130/69   follows with Dr. Harlow Asa with cardiology    Continue hCTZ, valsartan, verapamil     4. Dyslipidemia   Lab Results   Component Value Date    CHOL 108 02/05/2022    TRIG 90 02/05/2022    HDL 34 (L) 02/05/2022    LDL 56 02/05/2022    VLDL 18 02/05/2022    NONHDLCHOL 51 11/25/2016    CHOLHDLC 3 02/05/2022   Continue lipitor 40 mg every other day--reduced from daily due to knee pain.   Encouraged TLC   Repeat FLP now.     5.  Diabetes microvascular complications:  No history of retinopathy and MACR was normal Feb 2024  Annual labs UTD in Feb 2024 -- repeat now.   Obtaining copies of pt's eye exam.     6. OSA  Encouraging CPAP adherence     7. Fatigue  Checking fasting Testosterone and TSH.     RTC in 6-12 months     Total time:  30 min with 20 min counseling about new med

## 2023-11-29 NOTE — Patient Instructions
It was nice to see you today--I'm so grateful you came in!     Goals we discussed today:     Keep up the good work!  Continue Mounjaro 7.5 mg weekly and metformin 500 mg daily   Get labs at your convenience-- fasting around 8 am     Please contact [facility] for any questions or abnormal glucose values (above 300 for 2 days in a row or less than 70 twice in a week).  [phone]     My nurse, Durenda Age, can be reached at [phone]

## 2023-11-30 DIAGNOSIS — E1140 Type 2 diabetes mellitus with diabetic neuropathy, unspecified: Secondary | ICD-10-CM

## 2023-11-30 DIAGNOSIS — R5383 Other fatigue: Secondary | ICD-10-CM

## 2024-01-12 ENCOUNTER — Encounter: Admit: 2024-01-12 | Discharge: 2024-01-12 | Payer: BC Managed Care – PPO

## 2024-01-12 DIAGNOSIS — E1140 Type 2 diabetes mellitus with diabetic neuropathy, unspecified: Secondary | ICD-10-CM

## 2024-01-12 LAB — COMPREHENSIVE METABOLIC PANEL
ALBUMIN: 3.9
ALK PHOSPHATASE: 112
ALT: 33 mmol/L (ref 137–147)
ANION GAP: 11 mg/dL — ABNORMAL HIGH (ref 70–100)
AST: 26
BLD UREA NITROGEN: 15
CALCIUM: 9.2
CHLORIDE: 104
CO2: 26
CREATININE: 1
GFR ESTIMATED: 84 mmol/L — ABNORMAL HIGH (ref 3.5–5.1)
GLUCOSE,PANEL: 97
POTASSIUM: 4.1
SODIUM: 141
TOTAL BILIRUBIN: 0.6
TOTAL PROTEIN: 7.3

## 2024-01-12 LAB — LIPID PROFILE
CHOLESTEROL/HDL %: 3
CHOLESTEROL: 124
HDL: 40
LDL: 62
TRIGLYCERIDES: 110
VLDL: 22

## 2024-01-12 LAB — MICROALB/CR RATIO-URINE RANDOM
MICROALBUMIN, RAN: 22
MICROALBUMIN/CR RATIO URINE: 5.2
UR CREATININE, RAN: 421 — ABNORMAL HIGH (ref 63–166)

## 2024-01-12 LAB — TESTOSTERONE,TOTAL: TESTOSTERONE, TOTAL: 479 ng/dL (ref 250–1100)

## 2024-01-12 LAB — PLATELET COUNT: PLATELET COUNT: 203

## 2024-01-18 ENCOUNTER — Encounter: Admit: 2024-01-18 | Discharge: 2024-01-18 | Payer: BC Managed Care – PPO

## 2024-01-18 ENCOUNTER — Ambulatory Visit: Admit: 2024-01-18 | Discharge: 2024-01-18 | Payer: BC Managed Care – PPO

## 2024-01-18 DIAGNOSIS — E1140 Type 2 diabetes mellitus with diabetic neuropathy, unspecified: Secondary | ICD-10-CM

## 2024-01-18 DIAGNOSIS — I10 Essential (primary) hypertension: Secondary | ICD-10-CM

## 2024-01-18 DIAGNOSIS — R5383 Other fatigue: Secondary | ICD-10-CM

## 2024-01-18 DIAGNOSIS — G4733 Obstructive sleep apnea (adult) (pediatric): Secondary | ICD-10-CM

## 2024-01-18 DIAGNOSIS — I2510 Atherosclerotic heart disease of native coronary artery without angina pectoris: Secondary | ICD-10-CM

## 2024-01-18 DIAGNOSIS — E1169 Type 2 diabetes mellitus with other specified complication: Secondary | ICD-10-CM

## 2024-01-18 NOTE — Progress Notes
 Date of Service: 01/18/2024    Jeremy Bullock is a 63 y.o. male.       HPI   Jeremy Bullock is followed for coronary artery disease, hypertension and hypercholesterolemia.  He is also followed for type 2 diabetes mellitus. His diverticulitis appears to be under good control.  He is currently taking Mounjaro to help encourage additional weight loss. 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  He is preparing to have a lot of calves [place].   Over the past 6 months, the patient has been doing well and reports no angina, congestive symptoms, palpitations, sensation of sustained forceful heart pounding, lightheadedness or syncope.  His exercise tolerance has been stable, although he does not have a regular exercise routine.  He is active at work but does not have a regular exercise routine.  He does have an elliptical and stationary bicycle at home that he does not use.  He states that he usually gets 8000 steps per day. The patient reports no claudication, myalgias, bleeding abnormalities, neurologic motor abnormalities or difficulty with speech.  Historically, I saw Jeremy Bullock back on 12/31/2015.  At that time he reported exertional chest discomfort suggestive for angina pectoris.  Corn[place] angiography was performed on 01/01/2016 and revealed severe three-vessel coronary disease.  On 01/04/2016, he then underwent coronary bypass grafting times 3 with left internal mammary artery to LAD, reverse saphenous vein graft to OM1, and reverse saphenous vein graft to D2.   Jeremy Bullock reports having COVID in December 2021.  He was pretty sick over 3 weeks with this COVID infection.  He then had COVID a second time in August 2022 but was symptomatic for only 2 days.         Vitals:    01/18/24 1034   BP: 127/68   BP Source: Arm, Left Upper   Pulse: 54   SpO2: 97%   O2 Device: None (Room air)   PainSc: Zero   Weight: 131.5 kg (290 lb)   Height: 185.4 cm (6' 1)     Body mass index is 38.26 kg/m?Jeremy Bullock     Past Medical History  Patient Active Problem List    Diagnosis Date Noted    Abnormal chest x-ray 07/13/2017    Coronary artery disease due to calcified coronary lesion 02/11/2016     01/2016 - CABG      Type 2 diabetes mellitus (CMS-HCC) 02/19/2015     07/2014 - Fasting glucose 162.  Hgb A1c 7.4%        Morbid obesity (CMS-HCC) 08/02/2011    Essential (primary) hypertension 05/22/2009    History of tobacco use 05/22/2009    Dyslipidemia 05/22/2009     borderline      Sleep apnea 05/22/2009     2004 - Diagnosis by sleep study at [facility], ordered by Dr. Gwenette Greet.  CPAP started.    09/2012 - Office consultation with Dr. Lucilla Lame.  Follow-up sleep study scheduled for 10/2012.           Review of Systems   Constitutional: Negative.   HENT: Negative.     Eyes: Negative.    Cardiovascular:  Positive for leg swelling.   Respiratory: Negative.     Endocrine: Negative.    Hematologic/Lymphatic: Negative.    Skin: Negative.    Musculoskeletal: Negative.    Gastrointestinal: Negative.    Genitourinary: Negative.    Neurological: Negative.  Psychiatric/Behavioral: Negative.     Allergic/Immunologic: Negative.        Physical Exam  GENERAL: The patient is well developed, overweight, well nourished, resting comfortably and in no distress.   HEENT: No abnormalities of the visible oro-nasopharynx, conjunctiva or sclera are noted.  NECK: There is no jugular venous distension. Carotids are palpable and without bruits. There is no thyroid enlargement.  Chest: Lung fields are clear to auscultation. There are no wheezes or crackles.  CV: There is a regular rhythm. The first and second heart sounds are normal. There are no murmurs, gallops or rubs.  His apical heart rate is 60 bpm.  ABD: The abdomen is soft and supple with normal bowel sounds. There is no hepatosplenomegaly, ascites, tenderness, masses or bruits.  Neuro: There are no focal motor defects. Ambulation is normal. Cognitive function appears normal.  Ext: There is no edema or evidence of deep vein thrombosis. Peripheral pulses are satisfactory.    SKIN: There are no rashes and no cellulitis  PSYCH: The patient is calm, rationale and oriented    Cardiovascular Studies  A twelve-lead ECG obtained on 06/29/2023 reveals normal sinus rhythm with a heart rate of 59 bpm. Premature atrial beats are seen. Left ventricular hypertrophy is noted with secondary ST-T wave abnormalities.   Echo Doppler 07/12/2023:  Interpretation Summary  No regional wall motion abnormalities are seen. Overall left ventricular systolic function appears normal. The estimated left ventricular ejection fraction is 60-65%.   Normal left ventricular diastolic function.   Right ventricular chamber dimensions and contractility appear normal.  Normal atrial chamber dimensions.  Mild aortic valve sclerosis without significant stenosis or regurgitation. There is no evidence of significant valvular regurgitation or stenosis by doppler exam.  The aortic root and the visualized portions of the ascending aorta appear normal in size.  No pericardial effusion is seen.  There has been no significant change when compared to the prior exam performed on 09/15/2021.    Cardiovascular Health Factors  Vitals BP Readings from Last 3 Encounters:   01/18/24 127/68   11/29/23 130/69   07/12/23 (!) 156/95     Wt Readings from Last 3 Encounters:   01/18/24 131.5 kg (290 lb)   11/29/23 128.4 kg (283 lb)   07/12/23 131.1 kg (289 lb)     BMI Readings from Last 3 Encounters:   01/18/24 38.26 kg/m?   11/29/23 37.34 kg/m?   07/12/23 38.14 kg/m?      Smoking Social History     Tobacco Use   Smoking Status Never   Smokeless Tobacco Former    Quit date: 11/07/1996      Lipid Profile Cholesterol   Date Value Ref Range Status   01/12/2024 124  Final     HDL   Date Value Ref Range Status   01/12/2024 40  Final     LDL   Date Value Ref Range Status   01/12/2024 62  Final     Triglycerides   Date Value Ref Range Status   01/12/2024 110  Final      Blood Sugar Hemoglobin A1C   Date Value Ref Range Status   08/19/2021 5.6  Final     Glucose   Date Value Ref Range Status   01/12/2024 97  Final   12/12/2022 95 70 - 105 mg/dL Final   29/56/2130 865 70 - 105 mg/dL Final     Glucose, POC   Date Value Ref Range Status   11/29/2023 105 (A) 70 -  100 Final   06/07/2023 166 (A) 70 - 100 Final   12/15/2022 114 (A) 70 - 100 Final          Problems Addressed Today  Hypertension.  Diabetes mellitus.  Hypercholesterolemia.  Coronary disease.    Assessment and Plan     Mr. Schnorr reports that he is currently doing well.  He reports no angina or congestive symptoms and his blood pressure and lipid profile are well-controlled.  He also reports that his hemoglobin A1c has been well-controlled.  Cardiovascular risk factor modification was reviewed in detail. Regular mild aerobic exercise, weight loss and adherence to a heart healthy diet were recommended.  I have asked him to return for follow-up in 6 months time. The total time spent during this interview and exam with preparation and chart review was 30 minutes.         Current Medications (including today's revisions)   acetaminophen (TYLENOL EXTRA STRENGTH) 500 mg tablet Take two tablets by mouth as Needed for Pain. Max of 4,000 mg of acetaminophen in 24 hours.    ascorbic acid (VITAMIN C PO) Take 1 tablet by mouth daily.    aspirin EC 81 mg tablet Take 1 Tab by mouth daily. Take with food.    atorvastatin (LIPITOR) 40 mg tablet TAKE 1 TABLET DAILY    finasteride (PROSCAR) 5 mg tablet Take one tablet by mouth daily.    hydroCHLOROthiazide (HYDRODIURIL) 25 mg tablet TAKE 1 TABLET DAILY    lancets 33 gauge (ONETOUCH DELICA 33 GUAGE) 33 gauge Use one each as directed twice daily as needed. E11.65  Collaborating MD:  Dr. Henrine Screws    meloxicam [facility]) 15 mg tablet Take one tablet by mouth as Needed.    metFORMIN-XR (GLUCOPHAGE XR) 500 mg extended release tablet Take one tablet by mouth daily with dinner. TAKE AS INSTRUCTED BY YOUR PRESCRIBER  Indications: type 2 diabetes mellitus    ONETOUCH VERIO TEST STRIPS test strip USE TWICE A DAY BEFORE MEALS AS DIRECTED    sertraline (ZOLOFT) 50 mg tablet TAKE ONE TABLET BY MOUTH ONCE DAILY    tamsulosin (FLOMAX) 0.4 mg capsule Take one capsule by mouth at bedtime daily.    tirzepatide (MOUNJARO) 7.5 mg/0.5 mL injector PEN Inject 0.5 mL under the skin every 7 days.    valsartan (DIOVAN) 160 mg tablet TAKE 1 TABLET DAILY    verapamil CR (VERELAN) 180 mg capsule Take one capsule by mouth daily.

## 2024-01-18 NOTE — Patient Instructions
 Continue current medication    Follow up in 6 months    Follow up as directed.  Call sooner if issues.  Call the [facility] nursing line at [phone].  Leave a detailed message for the nurse in [place] [facility] with how we can assist you and we will call you back.

## 2024-01-23 ENCOUNTER — Encounter: Admit: 2024-01-23 | Discharge: 2024-01-23 | Payer: BC Managed Care – PPO

## 2024-01-23 MED ORDER — HYDROCHLOROTHIAZIDE 25 MG PO TAB
25 mg | ORAL_TABLET | Freq: Every day | ORAL | 3 refills | 28.00000 days | Status: AC
Start: 2024-01-23 — End: ?

## 2024-03-11 ENCOUNTER — Encounter: Admit: 2024-03-11 | Discharge: 2024-03-11 | Payer: BLUE CROSS/BLUE SHIELD

## 2024-03-11 MED ORDER — ATORVASTATIN 40 MG PO TAB
40 mg | ORAL_TABLET | Freq: Every day | ORAL | 3 refills | 90.00000 days | Status: AC
Start: 2024-03-11 — End: ?

## 2024-05-01 ENCOUNTER — Encounter: Admit: 2024-05-01 | Discharge: 2024-05-01 | Payer: BLUE CROSS/BLUE SHIELD

## 2024-05-01 ENCOUNTER — Ambulatory Visit: Admit: 2024-05-01 | Discharge: 2024-05-02 | Payer: BLUE CROSS/BLUE SHIELD

## 2024-05-01 DIAGNOSIS — I10 Essential (primary) hypertension: Secondary | ICD-10-CM

## 2024-05-01 DIAGNOSIS — E785 Hyperlipidemia, unspecified: Secondary | ICD-10-CM

## 2024-05-01 DIAGNOSIS — E66812 Class 2 severe obesity due to excess calories with serious comorbidity and body mass index (BMI) of 37.0 to 37.9 in adult (CMS-HCC): Secondary | ICD-10-CM

## 2024-05-01 DIAGNOSIS — G4733 Obstructive sleep apnea (adult) (pediatric): Secondary | ICD-10-CM

## 2024-05-01 NOTE — Progress Notes
 Date of Service: 05/01/2024     Subjective:             Jeremy Bullock is a 63 y.o. male.    Diabetes    Pt presents to clinic for management of T2 DM.  Last seen with me in Jan 2025.     Interval changes:     Switched to Mounjaro  from Ozempic  last year to worsening GI side effects on Ozempic .  These have improved on Mounjaro .     Weight down about 5 lbs from last visit.     Wt Readings from Last 5 Encounters:   05/01/24 129.5 kg (285 lb 9.6 oz)   01/18/24 131.5 kg (290 lb)   11/29/23 128.4 kg (283 lb)   07/12/23 131.1 kg (289 lb)   06/29/23 131.3 kg (289 lb 6.4 oz)     A1C is 6% June 2025; 5.9% Jan 2025; down from 6.2% August 2024; 6.1% Feb 2024; 6.3% in August 2023 , was previously 5.6 in 08/2021.     Does have c/o fatigue, decreased libido. Wondering if his testosterone levels can be checked.   Also has hx of OSA and is adherent to CPAP.     T2 DM:   Ik:ipjhwndzi in 2017    Current treatment:  metformin  XR 500 mg QD   Mounjaro  7.5 mg weekly     Medication adherent: all of the time     Past treatment:   used lantus and novolog while inpt after CABG  Ozempic  2 mg weekly-- having some nausea     SMBG and consistency: daily One touch verio; running 100-130 with average BG 118 mg/dl     Hyperglycemia: when he eats more  Hypoglycemia: none.  Felt symptoms when blood sugars in the 90 range especially when he took metformin  and did not eat immediately.    Meals per day: 3.  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     Complications of DM:  CAD: yes, CABG in 2017  CVA: no  PVD: no  Amputations: no  Retinopathy: no  Gastropathy: no  Nephropathy: no   Neuropathy: mild     Eye exam: June 2025  -- no retinopathy noted but pt reports some spots-- will obtain copy   Foot exam  completed today-- no wounds today     Medications:  Statin: yes  ACE-I or ARB: yes  ASA: yes    Past Medical History:    Anxiety    Coronary artery disease Depression    DM (diabetes mellitus) (CMS-HCC)    Dyslipidemia    Essential (primary) hypertension    History of tobacco use    Obstructive sleep apnea    Sleep apnea     Surgical History:   Procedure Laterality Date    Left Heart Cath With Ventriculogram N/A 01/01/2016    Performed by Cath, Physician at [facility] CATH LAB    Coronary Angiography N/A 01/01/2016    Performed by Cath, Physician at [facility] CATH LAB    Possible Percutaneous Coronary Intervention N/A 01/01/2016    Performed by Cath, Physician at [facility] CATH LAB    CORONARY ARTERY BYPASS GRAFT  01/04/16    three vessel    BYPASS GRAFT CORONARY ARTERY X3,  EVH, LIMA N/A 01/04/2016    Performed by Meriel Zachary Collins, MD at [facility] CVOR    COLONOSCOPY  N/A 01/08/2016    Performed by Malissa Norleen LABOR, MD at [facility] ENDO    HX HEART CATHETERIZATION       Family History   Problem Relation Name Age of Onset    High Cholesterol Mother      Cancer Mother      Pulmonary HTN Mother      Diabetes Father      Coronary Artery Disease Father      Heart Attack Father       Social History     Socioeconomic History    Marital status: Married     Spouse name: Ginnie    Number of children: 3    Years of education: 14   Occupational History    Occupation: Maintenance Superintendent     Employer: [employer]   Tobacco Use    Smoking status: Never    Smokeless tobacco: Former     Quit date: 11/07/1996   Substance and Sexual Activity    Alcohol use: Yes     Alcohol/week: 1.0 - 2.0 standard drink of alcohol     Types: 1 - 2 Cans of beer per week     Comment: occasionally    Drug use: No    Sexual activity: Yes     Partners: Female     Labs:  Glucose, POC   Date/Time Value Ref Range Status   11/29/2023 09:08 AM 105 (A) 70 - 100 Final     Cholesterol   Date/Time Value Ref Range Status   01/12/2024 12:00 AM 124  Final     HDL   Date/Time Value Ref Range Status   01/12/2024 12:00 AM 40  Final     LDL   Date/Time Value Ref Range Status   01/12/2024 12:00 AM 62  Final     Triglycerides   Date/Time Value Ref Range Status   01/12/2024 12:00 AM 110  Final     Non HDL Cholesterol   Date/Time Value Ref Range Status   11/25/2016 11:58 AM 51 MG/DL Final     Comment:     Calculated non-HDL Cholesterol (non-HDL-C) indirectly measures LDL-C, Lp(a),   IDL-C, and VLDL-C.  It is a surrogate marker for Apoprotein B.  Goal should be   less than 130 mg/dL.       Potassium   Date/Time Value Ref Range Status   01/12/2024 12:00 AM 4.1  Final     Blood Urea Nitrogen   Date/Time Value Ref Range Status   01/12/2024 12:00 AM 15.8  Final     Creatinine   Date/Time Value Ref Range Status   01/12/2024 12:00 AM 1.02  Final     AST (SGOT)   Date/Time Value Ref Range Status   01/12/2024 12:00 AM 26  Final     ALT (SGPT)   Date/Time Value Ref Range Status   01/12/2024 12:00 AM 33  Final     TSH   Date/Time Value Ref Range Status   12/12/2022 12:00 AM 1.61 0.35 - 4.94 MIU/mL Final     Vitamin D(25-OH)Total   Date/Time Value Ref Range Status   11/25/2016 11:58 AM 30.9 30 - 80 NG/ML Final        Review of Systems   Gastrointestinal:  Positive for constipation, diarrhea and nausea.        Intermittently with GLP1   All other systems reviewed and are negative.      Objective:          acetaminophen (  TYLENOL EXTRA STRENGTH) 500 mg tablet Take two tablets by mouth as Needed for Pain. Max of 4,000 mg of acetaminophen in 24 hours.    ascorbic acid (VITAMIN C PO) Take 1 tablet by mouth daily.    aspirin EC 81 mg tablet Take 1 Tab by mouth daily. Take with food.    atorvastatin  (LIPITOR) 40 mg tablet TAKE 1 TABLET DAILY    finasteride (PROSCAR) 5 mg tablet Take one tablet by mouth daily.    hydroCHLOROthiazide  (HYDRODIURIL ) 25 mg tablet TAKE 1 TABLET DAILY    lancets 33 gauge (ONETOUCH DELICA 33 GUAGE) 33 gauge Use one each as directed twice daily as needed. E11.65  Collaborating MD:  Dr. R Karnchanasorn    meloxicam [facility]) 15 mg tablet Take one tablet by mouth as Needed.    metFORMIN -XR (GLUCOPHAGE  XR) 500 mg extended release tablet Take one tablet by mouth daily with dinner. TAKE AS INSTRUCTED BY YOUR PRESCRIBER  Indications: type 2 diabetes mellitus    ONETOUCH VERIO TEST STRIPS test strip USE TWICE A DAY BEFORE MEALS AS DIRECTED    sertraline (ZOLOFT) 50 mg tablet TAKE ONE TABLET BY MOUTH ONCE DAILY    tamsulosin (FLOMAX) 0.4 mg capsule Take one capsule by mouth at bedtime daily.    tirzepatide  (MOUNJARO ) 7.5 mg/0.5 mL injector PEN Inject 0.5 mL under the skin every 7 days.    valsartan  (DIOVAN ) 160 mg tablet TAKE 1 TABLET DAILY    verapamil  CR (VERELAN ) 180 mg capsule Take one capsule by mouth daily.     Vitals:    05/01/24 0905   BP: (!) 143/78   BP Source: Arm, Left Upper   Pulse: 69   Temp: 36.6 ?C (97.8 ?F)   TempSrc: Temporal   PainSc: Zero   Weight: 129.5 kg (285 lb 9.6 oz)   Height: 185.4 cm (6' 1)     Body mass index is 37.68 kg/m?SABRA     Wt Readings from Last 5 Encounters:   01/18/24 131.5 kg (290 lb)   11/29/23 128.4 kg (283 lb)   07/12/23 131.1 kg (289 lb)   06/29/23 131.3 kg (289 lb 6.4 oz)   06/07/23 128.4 kg (283 lb)     Physical Exam  Constitutional:       Appearance: He is well-developed. He is obese.   HENT:      Head: Normocephalic and atraumatic.   Neck:      Thyroid: No thyromegaly.   Pulmonary:      Effort: Pulmonary effort is normal. No respiratory distress.   Musculoskeletal:      Cervical back: Normal range of motion.   Skin:     General: Skin is warm and dry.   Neurological:      Mental Status: He is alert and oriented to person, place, and time.   Psychiatric:         Behavior: Behavior normal.       Diabetic Foot Exam       UTD in Jan 2025        Assessment and Plan:  1.T2 DM,  controlled but some post prandial hyperglycemia.   A1C is 6% now, target less than 7.   Peripheral neuropathy     Continue metformin  XR 500 mg QD with food-- did not tolerate higher doses   Continue Mounjaro  7.5 mg weekly as A1c at target.     -can consider increasing dose if needed for additional weight loss next visit.   Advised smaller portion sizes  Continue exercise and healthy eating    Annual labs UTD in March 2025     Check glucose weekly and may alternate between HS or fasting     Neuropathy: stable.     2. Obesity  Discussed patient's BMI with him.  The body mass index is unknown because there is no height or weight on file. and falls within the category of Obesity 2; BMI plan is in progress and counseled regarding weight loss.  Continue Mounjaro  7.5 mg weekly.   Encouraged TLC     3. Hypertension at goal less than 130/80.  BP Readings from Last 1 Encounters:   05/01/24 (!) 143/78   follows with Dr. Hassell with cardiology    Continue hCTZ, losartan (switched from valsartan  by PCP recently), verapamil      4. Dyslipidemia   Lab Results   Component Value Date    CHOL 124 01/12/2024    TRIG 110 01/12/2024    HDL 40 01/12/2024    LDL 62 01/12/2024    VLDL 22 01/12/2024    NONHDLCHOL 51 11/25/2016    CHOLHDLC 3 01/12/2024   Continue lipitor 40 mg every other day--reduced from daily due to knee pain.   Encouraged TLC   UTD in March 2025     5.  Diabetes microvascular complications:  No history of retinopathy and MACR was normal Feb 2024  Annual labs UTD In March 2025   Obtaining copies of pt's eye exam.     6. OSA  Encouraging CPAP adherence     7. Fatigue  Testosterone WNL in March 2025   Ordered TSH but was not completed.     RTC in 6-12 months     Total time:  30 min with 20 min counseling about new med

## 2024-05-01 NOTE — Telephone Encounter
 Faxed [facility] to [phone]  Received fax confirmation

## 2024-05-02 DIAGNOSIS — E1140 Type 2 diabetes mellitus with diabetic neuropathy, unspecified: Secondary | ICD-10-CM

## 2024-08-15 ENCOUNTER — Encounter: Admit: 2024-08-15 | Discharge: 2024-08-15 | Payer: BLUE CROSS/BLUE SHIELD

## 2024-08-15 ENCOUNTER — Ambulatory Visit: Admit: 2024-08-15 | Discharge: 2024-08-15 | Payer: BLUE CROSS/BLUE SHIELD

## 2024-08-15 VITALS — BP 142/87 | HR 74 | Ht 73.0 in | Wt 290.0 lb

## 2024-08-15 DIAGNOSIS — I2510 Atherosclerotic heart disease of native coronary artery without angina pectoris: Secondary | ICD-10-CM

## 2024-08-15 DIAGNOSIS — Z136 Encounter for screening for cardiovascular disorders: Secondary | ICD-10-CM

## 2024-08-15 DIAGNOSIS — G4733 Obstructive sleep apnea (adult) (pediatric): Secondary | ICD-10-CM

## 2024-08-15 DIAGNOSIS — E1169 Type 2 diabetes mellitus with other specified complication: Secondary | ICD-10-CM

## 2024-08-15 DIAGNOSIS — I10 Essential (primary) hypertension: Principal | ICD-10-CM

## 2024-08-15 NOTE — Patient Instructions
 Thank you for visiting our office today.    We would like to make the following medication adjustments:  NONE       Otherwise continue the same medications as you have been doing.          We will be pursuing the following tests after your appointment today:       Orders Placed This Encounter    ECG 12-LEAD     **Monitor your blood pressures and heart rates. Call in 2 months with readings**    We will plan to see you back in 6 months.  Please call us  in the meantime with any questions or concerns.        Please allow 5-7 business days for our providers to review your results. All normal results will go to MyChart. If you do not have Mychart, it is strongly recommended to get this so you can easily view all your results. If you do not have mychart, we will attempt to call you once with normal lab and testing results. If we cannot reach you by phone with normal results, we will send you a letter.  If you have not heard the results of your testing after one week please give us  a call.       Your Cardiovascular Medicine [place] Team [place], Olam Pierce, Andrea, and [place])  phone number is [phone].

## 2024-08-15 NOTE — Progress Notes
 Date of Service: 08/15/2024    Jeremy Bullock is a 63 y.o. male.       HPI   Jeremy Bullock is followed for coronary artery disease, hypertension and hypercholesterolemia.  He is also followed for type 2 diabetes mellitus. His diverticulitis appears to be under good control.  He is currently taking Mounjaro  to help encourage additional weight loss. 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  He does have an elliptical and stationary bicycle at home that he does not use.  He states that he usually gets 8000 steps per day.  He has not been checking his blood pressure at home although he does have a blood pressure cuff.  Over the past 6 months, the patient has been doing well and reports no angina, congestive symptoms, palpitations, sensation of sustained forceful heart pounding, lightheadedness or syncope.   The patient reports no claudication, myalgias, bleeding abnormalities, neurologic motor abnormalities or difficulty with speech.  Historically, I saw Jeremy Bullock back on 12/31/2015.  At that time he reported exertional chest discomfort suggestive for angina pectoris.  Cornary angiography was performed on 01/01/2016 and revealed severe three-vessel coronary disease.  On 01/04/2016, he then underwent coronary bypass grafting times 3 with left internal mammary artery to LAD, reverse saphenous vein graft to OM1, and reverse saphenous vein graft to D2.   Jeremy Bullock reports having COVID in December 2021.  He was pretty sick over 3 weeks with this COVID infection.  He then had COVID a second time in August 2022 but was symptomatic for only 2 days.         Vitals:    08/15/24 0957   BP: (!) 142/87   BP Source: Arm, Left Upper   Pulse: 74   SpO2: 98%   O2 Device: None (Room air)   PainSc: Zero   Weight: 131.5 kg (290 lb)   Height: 185.4 cm (6' 1)     Body mass index is 38.26 kg/m?SABRA     Past Medical History  Patient Active Problem List    Diagnosis Date Noted    Abnormal chest x-ray 07/13/2017    Coronary artery disease due to calcified coronary lesion 02/11/2016     01/2016 - CABG      Type 2 diabetes mellitus (CMS-HCC) 02/19/2015     07/2014 - Fasting glucose 162.  Hgb A1c 7.4%        Morbid obesity (CMS-HCC) 08/02/2011    Essential (primary) hypertension 05/22/2009    History of tobacco use 05/22/2009    Dyslipidemia 05/22/2009     borderline      Sleep apnea 05/22/2009     2004 - Diagnosis by sleep study at [facility], ordered by Dr. Norleen Jaffe.  CPAP started.    09/2012 - Office consultation with Dr. Darina Brookes.  Follow-up sleep study scheduled for 10/2012.           Review of Systems   Constitutional: Positive for malaise/fatigue.   HENT: Negative.     Eyes: Negative.    Cardiovascular: Negative.    Respiratory: Negative.     Endocrine: Negative.    Hematologic/Lymphatic: Negative.    Skin: Negative.    Musculoskeletal: Negative.    Gastrointestinal: Negative.    Genitourinary: Negative.    Neurological: Negative.    Psychiatric/Behavioral: Negative.     Allergic/Immunologic:  Negative.        Physical Exam  GENERAL: The patient is well developed, overweight, well nourished, resting comfortably and in no distress.   HEENT: No abnormalities of the visible oro-nasopharynx, conjunctiva or sclera are noted.  NECK: There is no jugular venous distension. Carotids are palpable and without bruits. There is no thyroid enlargement.  Chest: Lung fields are clear to auscultation. There are no wheezes or crackles.  CV: There is a regular rhythm. The first and second heart sounds are normal. There are no murmurs, gallops or rubs.  His apical heart rate is 60 bpm.  ABD: The abdomen is soft and supple with normal bowel sounds. There is no hepatosplenomegaly, ascites, tenderness, masses or bruits.  Neuro: There are no focal motor defects. Ambulation is normal. Cognitive function appears normal.  Ext: There is no edema or evidence of deep vein thrombosis. Peripheral pulses are satisfactory.    SKIN: There are no rashes and no cellulitis  PSYCH: The patient is calm, rationale and oriented    Cardiovascular Studies  A twelve-lead ECG obtained on 08/15/2024 reveals mild sinus bradycardia with a heart rate of 56 bpm.  T wave inversions noted in lead I and aVL with nondiagnostic ST-T wave changes in the anterior precordial leads.  The T wave inversion noted in lead II and aVF on the prior ECG from June 29, 2023 has resolved.  Labs from 01/12/2024 revealed serum creatinine = 1.02, potassium = 4.1, total cholesterol = 124, triglycerides = 110, HDL = 40 and LDL cholesterol = 62 g/dL.  ALT = 33.  Echo Doppler 07/12/2023:  Interpretation Summary  No regional wall motion abnormalities are seen. Overall left ventricular systolic function appears normal. The estimated left ventricular ejection fraction is 60-65%.   Normal left ventricular diastolic function.   Right ventricular chamber dimensions and contractility appear normal.  Normal atrial chamber dimensions.  Mild aortic valve sclerosis without significant stenosis or regurgitation. There is no evidence of significant valvular regurgitation or stenosis by doppler exam.  The aortic root and the visualized portions of the ascending aorta appear normal in size.  No pericardial effusion is seen.  There has been no significant change when compared to the prior exam performed on 09/15/2021.  Cardiovascular Health Factors  Vitals BP Readings from Last 3 Encounters:   08/15/24 (!) 142/87   05/01/24 (!) 143/78   01/18/24 127/68     Wt Readings from Last 3 Encounters:   08/15/24 131.5 kg (290 lb)   05/01/24 129.5 kg (285 lb 9.6 oz)   01/18/24 131.5 kg (290 lb)     BMI Readings from Last 3 Encounters:   08/15/24 38.26 kg/m?   05/01/24 37.68 kg/m?   01/18/24 38.26 kg/m?      Smoking Tobacco Use History[1]   Lipid Profile Cholesterol   Date Value Ref Range Status   01/12/2024 124  Final     HDL Date Value Ref Range Status   01/12/2024 40  Final     LDL   Date Value Ref Range Status   01/12/2024 62  Final     Triglycerides   Date Value Ref Range Status   01/12/2024 110  Final      Blood Sugar Hemoglobin A1C   Date Value Ref Range Status   08/19/2021 5.6  Final     Glucose   Date Value Ref Range Status   01/12/2024 97  Final   12/12/2022 95 70 - 105 mg/dL Final   89/86/7977 861  70 - 105 mg/dL Final     Glucose, POC   Date Value Ref Range Status   05/01/2024 178 (A) 70 - 100 Final   11/29/2023 105 (A) 70 - 100 Final   06/07/2023 166 (A) 70 - 100 Final          Problems Addressed Today  Encounter Diagnoses   Name Primary?    Screening for heart disease Yes    Essential (primary) hypertension     Coronary artery disease due to calcified coronary lesion     Type 2 diabetes mellitus with other specified complication, unspecified whether long term insulin use (CMS-HCC)     Obstructive sleep apnea syndrome        Assessment and Plan   Jeremy Bullock reports that he is currently doing well.  He reports no angina or congestive symptoms and his blood pressure and lipid profile are well-controlled.  His blood pressure was elevated in clinic today.  I have asked him to record his blood pressure twice weekly and report the results to us  within the next 2 months to see if he requires additional antihypertensive medication.  Cardiovascular risk factor modification was reviewed in detail. Regular mild aerobic exercise, weight loss and adherence to a heart healthy diet were recommended.  I have asked him to return for follow-up in 6 months time. The total time spent during this interview and exam with preparation and chart review was 30 minutes.          Current Medications (including today's revisions)   acetaminophen (TYLENOL EXTRA STRENGTH) 500 mg tablet Take two tablets by mouth as Needed for Pain. Max of 4,000 mg of acetaminophen in 24 hours.    ascorbic acid (VITAMIN C PO) Take 1 tablet by mouth daily.    aspirin EC 81 mg tablet Take 1 Tab by mouth daily. Take with food.    atorvastatin  (LIPITOR) 40 mg tablet TAKE 1 TABLET DAILY    finasteride (PROSCAR) 5 mg tablet Take one tablet by mouth daily.    hydroCHLOROthiazide  (HYDRODIURIL ) 25 mg tablet TAKE 1 TABLET DAILY    lancets 33 gauge (ONETOUCH DELICA 33 GUAGE) 33 gauge Use one each as directed twice daily as needed. E11.65  Collaborating MD:  Dr. R Karnchanasorn    losartan (COZAAR) 50 mg tablet Take one tablet by mouth daily.    meloxicam (MOBIC) 15 mg tablet Take one tablet by mouth as Needed.    metFORMIN -XR (GLUCOPHAGE  XR) 500 mg extended release tablet Take one tablet by mouth daily with dinner. TAKE AS INSTRUCTED BY YOUR PRESCRIBER  Indications: type 2 diabetes mellitus    ONETOUCH VERIO TEST STRIPS test strip USE TWICE A DAY BEFORE MEALS AS DIRECTED    sertraline (ZOLOFT) 50 mg tablet TAKE ONE TABLET BY MOUTH ONCE DAILY    tamsulosin (FLOMAX) 0.4 mg capsule Take one capsule by mouth at bedtime daily.    tirzepatide  (MOUNJARO ) 7.5 mg/0.5 mL injector PEN Inject 0.5 mL under the skin every 7 days.    verapamil  CR (VERELAN ) 180 mg capsule Take one capsule by mouth daily.                 [1]   Social History  Tobacco Use   Smoking Status Never    Passive exposure: Never   Smokeless Tobacco Former    Quit date: 11/07/1996

## 2024-10-15 ENCOUNTER — Encounter: Admit: 2024-10-15 | Discharge: 2024-10-15 | Payer: BLUE CROSS/BLUE SHIELD

## 2024-10-15 ENCOUNTER — Inpatient Hospital Stay: Admit: 2024-10-15 | Discharge: 2024-10-15 | Payer: BLUE CROSS/BLUE SHIELD

## 2024-10-15 VITALS — BP 139/88 | HR 67 | Ht 73.0 in | Wt 284.4 lb

## 2024-10-15 DIAGNOSIS — I209 Angina pectoris, unspecified: Secondary | ICD-10-CM

## 2024-10-15 DIAGNOSIS — R0989 Other specified symptoms and signs involving the circulatory and respiratory systems: Principal | ICD-10-CM

## 2024-10-15 DIAGNOSIS — I2510 Atherosclerotic heart disease of native coronary artery without angina pectoris: Principal | ICD-10-CM

## 2024-10-15 MED ORDER — HYDROCHLOROTHIAZIDE 25 MG PO TAB
25 mg | Freq: Every day | ORAL | 0 refills | Status: DC
Start: 2024-10-15 — End: 2024-10-17
  Administered 2024-10-17: 15:00:00 25 mg via ORAL

## 2024-10-15 MED ORDER — TAMSULOSIN 0.4 MG PO CAP
.4 mg | Freq: Every evening | ORAL | 0 refills | Status: DC
Start: 2024-10-15 — End: 2024-10-17
  Administered 2024-10-16 – 2024-10-17 (×2): 0.4 mg via ORAL

## 2024-10-15 MED ORDER — LOSARTAN 50 MG PO TAB
50 mg | Freq: Every day | ORAL | 0 refills | Status: DC
Start: 2024-10-15 — End: 2024-10-17
  Administered 2024-10-16 – 2024-10-17 (×2): 50 mg via ORAL

## 2024-10-15 MED ORDER — CARVEDILOL 3.125 MG PO TAB
3.125 mg | ORAL_TABLET | Freq: Two times a day (BID) | ORAL | 1 refills | Status: SS
Start: 2024-10-15 — End: ?

## 2024-10-15 MED ORDER — ASCORBIC ACID (VITAMIN C) 500 MG PO TAB
250 mg | Freq: Every day | ORAL | 0 refills | Status: DC
Start: 2024-10-15 — End: 2024-10-17
  Administered 2024-10-16 – 2024-10-17 (×2): 250 mg via ORAL

## 2024-10-15 MED ORDER — DEXTROSE 50 % IN WATER (D50W) IV SYRG
12.5-25 g | INTRAVENOUS | 0 refills | Status: DC | PRN
Start: 2024-10-15 — End: 2024-10-17
  Administered 2024-10-16: 19:00:00 25 mL via INTRAVENOUS

## 2024-10-15 MED ORDER — SODIUM CHLORIDE 0.9% IV BOLUS
500 mL | Freq: Once | INTRAVENOUS | 0 refills | Status: CP
Start: 2024-10-15 — End: ?
  Administered 2024-10-16: 18:00:00 500 mL via INTRAVENOUS

## 2024-10-15 MED ORDER — TAMSULOSIN 0.4 MG PO CAP
.4 mg | Freq: Every evening | ORAL | 0 refills | Status: DC
Start: 2024-10-15 — End: 2024-10-16

## 2024-10-15 MED ORDER — ATORVASTATIN 40 MG PO TAB
40 mg | Freq: Every day | ORAL | 0 refills | Status: DC
Start: 2024-10-15 — End: 2024-10-17
  Administered 2024-10-16 – 2024-10-17 (×2): 40 mg via ORAL

## 2024-10-15 MED ORDER — VERAPAMIL 180 MG PO TBER
180 mg | Freq: Every day | ORAL | 0 refills | Status: DC
Start: 2024-10-15 — End: 2024-10-17
  Administered 2024-10-16 – 2024-10-17 (×2): 180 mg via ORAL

## 2024-10-15 MED ORDER — NITROGLYCERIN 0.4 MG SL SUBL
.4 mg | ORAL_TABLET | SUBLINGUAL | 3 refills | Status: SS | PRN
Start: 2024-10-15 — End: ?

## 2024-10-15 MED ORDER — ASPIRIN 325 MG PO TAB
325 mg | Freq: Once | ORAL | 0 refills | Status: CP
Start: 2024-10-15 — End: ?
  Administered 2024-10-16: 15:00:00 325 mg via ORAL

## 2024-10-15 MED ORDER — INSULIN ASPART 100 UNIT/ML SC FLEXPEN
0-6 [IU] | Freq: Before meals | SUBCUTANEOUS | 0 refills | Status: DC
Start: 2024-10-15 — End: 2024-10-17

## 2024-10-15 MED ORDER — ASPIRIN 81 MG PO TBEC
81 mg | Freq: Every day | ORAL | 0 refills | Status: DC
Start: 2024-10-15 — End: 2024-10-16

## 2024-10-15 MED ORDER — MELATONIN 5 MG PO TAB
5 mg | Freq: Every evening | ORAL | 0 refills | Status: DC | PRN
Start: 2024-10-15 — End: 2024-10-17

## 2024-10-15 MED ORDER — ACETAMINOPHEN 325 MG PO TAB
650 mg | ORAL | 0 refills | Status: DC | PRN
Start: 2024-10-15 — End: 2024-10-17
  Administered 2024-10-17: 14:00:00 650 mg via ORAL

## 2024-10-15 MED ORDER — NITROGLYCERIN 0.4 MG SL SUBL
.4 mg | SUBLINGUAL | 0 refills | Status: DC | PRN
Start: 2024-10-15 — End: 2024-10-17

## 2024-10-15 MED ORDER — SODIUM CHLORIDE 0.9% IV BOLUS
500 mL | Freq: Once | INTRAVENOUS | 0 refills | Status: DC
Start: 2024-10-15 — End: 2024-10-16

## 2024-10-15 MED ORDER — CARVEDILOL 3.125 MG PO TAB
3.125 mg | Freq: Two times a day (BID) | ORAL | 0 refills | Status: DC
Start: 2024-10-15 — End: 2024-10-17
  Administered 2024-10-16 – 2024-10-17 (×4): 3.125 mg via ORAL

## 2024-10-15 MED ORDER — FINASTERIDE 5 MG PO TAB
5 mg | Freq: Every day | ORAL | 0 refills | Status: DC
Start: 2024-10-15 — End: 2024-10-17
  Administered 2024-10-16 – 2024-10-17 (×2): 5 mg via ORAL

## 2024-10-15 MED ORDER — SERTRALINE 50 MG PO TAB
50 mg | Freq: Every day | ORAL | 0 refills | Status: DC
Start: 2024-10-15 — End: 2024-10-17
  Administered 2024-10-16 – 2024-10-17 (×2): 50 mg via ORAL

## 2024-10-15 NOTE — Telephone Encounter [36]
 Discussed with Dr. Debroah he recommends for patient to come to clinic for evaluation. Patient verbalized understanding and will come in now.

## 2024-10-15 NOTE — Patient Instructions [37]
 Thank you for visiting our office today.    We would like to make the following medication adjustments:      Coreg  3.125mg  twice daily   Nitro as needed       Otherwise continue the same medications as you have been doing.          We will be pursuing the following tests after your appointment today:       Orders Placed This Encounter    ECG 12-LEAD    carvediloL  (COREG ) 3.125 mg tablet    nitroglycerin  (NITROSTAT ) 0.4 mg tablet         We will plan to see you back in April.  Please call us  in the meantime with any questions or concerns.        Please allow 5-7 business days for our providers to review your results. All normal results will go to MyChart. If you do not have Mychart, it is strongly recommended to get this so you can easily view all your results. If you do not have mychart, we will attempt to call you once with normal lab and testing results. If we cannot reach you by phone with normal results, we will send you a letter.  If you have not heard the results of your testing after one week please give us  a call.       Your Cardiovascular Medicine [place] Team [place], Olam Pierce, Andrea, and [place])  phone number is [phone].

## 2024-10-15 NOTE — H&P [4]
 Cardiology History and Physical      Patient's Name:  Jeremy Bullock MRN: [id number]   Today's Date:  10/15/2024  Admission Date: 10/15/2024  LOS: 0 days    Problem list  Principal Problem:    Angina pectoris, unspecified  Active Problems:    Essential (primary) hypertension    History of tobacco use    Dyslipidemia    Type 2 diabetes mellitus (CMS-HCC)    Coronary artery disease due to calcified coronary lesion      History of Present Illness:     Chief Complaint:  exertional angina     Jeremy Bullock is a 63 y.o. man with a pertinent PMH of CAD s/p CABG x3 (12/2015) with angina, HTN, HLD, T2DM, who was admitted on 10/15/2024 for worsening symptoms of exertional  angina with significant symptoms lasting approximately 15 minutes and need for LHC and potential coronary intervention.    He was direct admitted from his outpatient cardiologist given his worsening exertional angina which has been ongoing for the past 3 weeks.  It is reportedly exertional and described as midsternal pressure-like discomfort with associated symptoms of dizziness, lightheadedness, and diaphoresis.  He reports that his anginal symptoms will resolve with rest, but it takes sometimes up to 15 minutes for the symptoms to resolve. Denies LOC, diaphoresis, palpitations, bleeding abnormalities, swelling of lower extremities, SOB/ Wheezing.       Patient has been taking all medications as prescribed.   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     Upon arrival patient is hemodynamically stable and is not having any symptoms. He says if he were to go up three fights of stairs he could guarantee his anginal symptoms would return. He has no other concerns aside from the angina and is overall doing really well.     Assessment and Plan:     Assessment & Plan  Angina pectoris, unspecified    Essential (primary) hypertension    History of tobacco use    Dyslipidemia    Type 2 diabetes mellitus (CMS-HCC)    Coronary artery disease due to calcified coronary lesion      #CAD s/p CABG x 3 (12/2015)  #Exertional Angina   #HLD  #HTN  - Describes 3 weeks of worsening anginal symptoms associated with exertion  - Most recent TTE from 07/2023 with EF 60-65%, no RWMA's  - S/p CABG x 3 (12/2015)  - EKG: HR 58, presence of PACs   - Most recent lipid profile from 01/2024 with LDL 62 and total cholesterol 124  - PTA meds: ASA 81, atorvastatin  40 mg, Coreg  3.125 mg twice daily, HCTZ 25 mg, losartan  50 mg, verapamil  180 mg daily  PLAN:  > No indication for heparin gtt at this time   > Echocardiogram ordered  > LHC planned for 12/10. NPO at MN  > Repeat lipid profile, troponin, TSH, A1c, BNP  > Continue PTA aspirin  81 mg  > Continue PTA atorvastatin  40 mg daily  > Continue PTA Coreg  3.125 mg twice daily  > Continue PTA verapamil  180 mg  > Holding PTA HCTZ and losartan  pending LHC on 12/10    #T2DM  #Obesity  - Most recent A1c 6.0 from 04/2024  - PTA on metformin  500 mg daily and tirzepatide  7.5 weekly  PLAN:  > Accu-Cheks ACHS  > LD CF  > Holding PTA metformin  and Tirzepatide  while admitted  >  Repeat A1c pending     #OSA  PLAN:  > CPAP therapy    #BPH  PLAN:  > Continue PTA Finasteride  5 mg QDAY   > Continue PTA Tamsulosin  0.4 mg QHS    #Mood Disorder  PLAN:  > Continue PTA Sertraline  50 mg QDAY    Diet: DIET NPO AT MIDNIGHT Sips With Medications  DIET NPO AT MIDNIGHT Sips With Medications  DIET CARDIAC(LOW FAT/LOW SODIUM)  VTE ppx: Holding fr LHC on 12/10  PT/OT: consulted  CODE: Full Code    DISPO: Admit to CV telemetry, plan for LHC on 12/10    Patient was discussed with Dr. Maurice Landsberg Charna) Jeremy Bullock, D.O   PGY-2 Internal Medicine   Available on Voalte       Subjective:     Patient is seen resting comfortably in his room during our encounter. He is feeling well at this time and even expresses that he feels bad taking a bed in the hospital from someone who may need it more.   Review of Systems:  Review of Systems   Constitutional:  Negative for chills and fever.   Respiratory:  Negative for sputum production, shortness of breath and wheezing.    Cardiovascular:  Positive for chest pain (exertional). Negative for palpitations and leg swelling.   Gastrointestinal:  Negative for nausea and vomiting.   Neurological:  Positive for dizziness (described as lightheadedness). Negative for loss of consciousness.       Past medical history:  The patient  has a past medical history of Anxiety, Coronary artery disease, Depression, DM (diabetes mellitus) (CMS-HCC), Dyslipidemia (05/22/2009), Essential (primary) hypertension (05/22/2009), History of tobacco use (05/22/2009), Obstructive sleep apnea, and Sleep apnea (05/22/2009).    Social history:    Social History     Tobacco Use    Smoking status: Never     Passive exposure: Never    Smokeless tobacco: Former     Quit date: 11/07/1996   Substance Use Topics    Alcohol use: Yes     Alcohol/week: 1.0 - 2.0 standard drink of alcohol     Types: 1 - 2 Cans of beer per week     Comment: occasionally    Drug use: No       Past surgical history:  The patient  has a past surgical history that includes coronary artery bypass graft (01/04/16); heart catheterization; Colonoscopy (N/A, 01/08/2016); coronary artery bypass graft (N/A, 01/04/2016); and percutaneous coronary intervention (N/A, 01/01/2016).    Family History:  family history includes Cancer in his mother; Coronary Artery Disease in his father; Diabetes in his father; Heart Attack in his father; High Cholesterol in his mother; Pulmonary HTN in his mother.    Objective:   Vital Signs:  BP 135/75  - Pulse 61  - Temp 36.7 ?C (98 ?F)  - Ht 185.4 cm (6' 1)  - Wt 128.4 kg (283 lb)  - SpO2 97%  - BMI 37.34 kg/m?     Physical Exam  Vitals and nursing note reviewed.   Constitutional:       General: He is not in acute distress.     Appearance: Normal appearance.   HENT:      Head: Normocephalic and atraumatic. Right Ear: External ear normal.      Left Ear: External ear normal.      Mouth/Throat:      Mouth: Mucous membranes are moist.   Eyes:      Extraocular Movements:  Extraocular movements intact.      Conjunctiva/sclera: Conjunctivae normal.   Cardiovascular:      Rate and Rhythm: Normal rate.      Pulses: Normal pulses.   Pulmonary:      Effort: Pulmonary effort is normal.   Abdominal:      General: There is no distension.      Tenderness: There is no abdominal tenderness.   Musculoskeletal:         General: Normal range of motion.      Cervical back: Normal range of motion.      Right lower leg: No edema.      Left lower leg: No edema.   Skin:     General: Skin is warm.   Neurological:      General: No focal deficit present.      Mental Status: He is alert and oriented to person, place, and time.   Psychiatric:         Mood and Affect: Mood normal.         Behavior: Behavior normal.         Thought Content: Thought content normal.         Judgment: Judgment normal.       Labs:  No results for input(s): HGB, HCT, WBC, PLTCT, NA, K, CL, CO2, BUN, CR, GLU, CA, MG, PO4, ALBUMIN, TOTPROT, TOTBILI, AST, ALT, ALKPHOS, AMY, LIPASE, PREALB, INR, PTT in the last 72 hours.     Pertinent Radiology Reviewed.     Meds:  Scheduled Meds:[START ON 10/16/2024] ascorbic acid  (vitamin C ) (C-500) tablet 250 mg, 250 mg, Oral, QDAY  [START ON 10/16/2024] aspirin  EC (ASPIR-LOW) tablet 81 mg, 81 mg, Oral, QDAY  [START ON 10/16/2024] aspirin  tablet 325 mg, 325 mg, Oral, ONCE  [START ON 10/16/2024] atorvastatin  (LIPITOR) tablet 40 mg, 40 mg, Oral, QDAY  carvediloL  (COREG ) tablet 3.125 mg, 3.125 mg, Oral, BID w/meals  [START ON 10/16/2024] finasteride  (PROSCAR ) tablet 5 mg, 5 mg, Oral, QDAY  [Held by Provider] hydroCHLOROthiazide  (HYDRODIURIL ) tablet 25 mg, 25 mg, Oral, QDAY  insulin  aspart (U-100) (NOVOLOG  FLEXPEN U-100 INSULIN ) injection PEN 0-6 Units, 0-6 Units, Subcutaneous, ACHS (22)  [Held by Provider] losartan  (COZAAR ) tablet 50 mg, 50 mg, Oral, QDAY  [START ON 10/16/2024] sertraline  (ZOLOFT ) tablet 50 mg, 50 mg, Oral, QDAY  [START ON 10/16/2024] sodium chloride  0.9% IV bolus 500 mL, 500 mL, Intravenous, ONCE  tamsulosin  (FLOMAX ) capsule 0.4 mg, 0.4 mg, Oral, QHS  verapamil  SR (CALAN  SR) tablet 180 mg, 180 mg, Oral, QDAY    Continuous Infusions:  PRN and Respiratory Meds:acetaminophen  Q6H PRN, dextrose  50% PRN, melatonin QHS PRN, nitroglycerin  Q5 MIN PRN

## 2024-10-15 NOTE — Progress Notes [1]
 Date of Service: 10/15/2024    Jeremy Bullock is a 63 y.o. male.       HPI   OLUWATOMIWA KINYON is followed for coronary artery disease, hypertension and hypercholesterolemia.  He is also followed for type 2 diabetes mellitus.  Mr. Portlock has developed new onset exertional chest discomfort consistent with angina.  It began 3 weeks ago and only occurs with activity such as chasing cattle.  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  It is strictly exertional and is described as a midsternal pressure-like discomfort associated with dizziness, lightheadedness and sweating.  It starts to resolve as soon as he starts to rest but takes approximately 15 minutes to completely return to normal.  His last episode occurred on October 12, 2024 chasing cattle.  The discomfort is definitely interfering with his lifestyle.  He is currently taking Mounjaro  to help encourage additional weight loss. 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  He does have an elliptical and stationary bicycle at home that he does not use.  He states that he usually gets 8000 steps per day.  Mr. Wishart reports that his blood pressure has been increased recently with systolic blood pressure readings exceeding 140 mmHg.  Otherwise Mr. Loveday reports no congestive symptoms, palpitations, sensation of sustained forceful heart pounding, or syncope.   The patient reports no claudication, myalgias, bleeding abnormalities, neurologic motor abnormalities or difficulty with speech.  Historically, I saw Mr. Eisemann back on 12/31/2015.  At that time he reported exertional chest discomfort suggestive for angina pectoris.  Cornary angiography was performed on 01/01/2016 and revealed severe three-vessel coronary disease.  On 01/04/2016, he then underwent coronary bypass grafting times 3 with left internal mammary artery to LAD, reverse saphenous vein graft to OM1, and reverse saphenous vein graft to D2.   Mr. Weatherford reports having COVID in December 2021.  He was pretty sick over 3 weeks with this COVID infection.  He then had COVID a second time in August 2022 but was symptomatic for only 2 days.         Objective   Vitals:    10/15/24 1258   BP: 139/88   BP Source: Arm, Left Upper   Pulse: 67   SpO2: 97%   O2 Device: None (Room air)   PainSc: One   Weight: 129 kg (284 lb 6.4 oz)   Height: 185.4 cm (6' 1)     Body mass index is 37.52 kg/m?SABRA     Past Medical History  Patient Active Problem List    Diagnosis Date Noted    Abnormal chest x-ray 07/13/2017    Coronary artery disease due to calcified coronary lesion 02/11/2016     01/2016 - CABG      Type 2 diabetes mellitus (CMS-HCC) 02/19/2015     07/2014 - Fasting glucose 162.  Hgb A1c 7.4%        Morbid obesity (CMS-HCC) 08/02/2011    Essential (primary) hypertension 05/22/2009    History of tobacco use 05/22/2009    Dyslipidemia 05/22/2009     borderline      Sleep apnea 05/22/2009     2004 - Diagnosis by sleep study at [facility], ordered by Dr. Norleen Jaffe.  CPAP started.    09/2012 - Office consultation with Dr. Darina Brookes.  Follow-up sleep study scheduled for  10/2012.         Review of Systems   Constitutional: Negative.   HENT: Negative.     Eyes: Negative.    Cardiovascular:  Positive for chest pain and dyspnea on exertion.   Respiratory: Negative.     Endocrine: Negative.    Hematologic/Lymphatic: Negative.    Skin: Negative.    Musculoskeletal: Negative.    Gastrointestinal: Negative.    Genitourinary: Negative.    Neurological:  Positive for dizziness.   Psychiatric/Behavioral: Negative.     Allergic/Immunologic: Negative.        Physical Exam  GENERAL: The patient is well developed, overweight, well nourished, resting comfortably and in no distress.   HEENT: No abnormalities of the visible oro-nasopharynx, conjunctiva or sclera are noted.  NECK: There is no jugular venous distension. Carotids are palpable and without bruits. There is no thyroid enlargement.  Chest: Lung fields are clear to auscultation. There are no wheezes or crackles.  CV: There is a regular rhythm. The first and second heart sounds are normal. There are no murmurs, gallops or rubs.  His apical heart rate is 68 bpm.  ABD: The abdomen is soft and supple with normal bowel sounds. There is no hepatosplenomegaly, ascites, tenderness, masses or bruits.  Neuro: There are no focal motor defects. Ambulation is normal. Cognitive function appears normal.  Ext: There is no edema or evidence of deep vein thrombosis. Peripheral pulses are satisfactory.    SKIN: There are no rashes and no cellulitis  PSYCH: The patient is calm, rationale and oriented    Cardiovascular Studies  A twelve-lead ECG obtained on 10/15/2024 reveals normal sinus rhythm with atrial bigeminy.  His heart rate is 69 bpm.  ST-T wave abnormalities are seen similar to the ECG obtained on August 15, 2024 with T wave inversion in lead V2 and lead I.  Cardiovascular Health Factors  Vitals BP Readings from Last 3 Encounters:   10/15/24 139/88   08/15/24 (!) 142/87   05/01/24 (!) 143/78     Wt Readings from Last 3 Encounters:   10/15/24 129 kg (284 lb 6.4 oz)   08/15/24 131.5 kg (290 lb)   05/01/24 129.5 kg (285 lb 9.6 oz)     BMI Readings from Last 3 Encounters:   10/15/24 37.52 kg/m?   08/15/24 38.26 kg/m?   05/01/24 37.68 kg/m?      Smoking Tobacco Use History[1]   Lipid Profile Cholesterol   Date Value Ref Range Status   01/12/2024 124  Final     HDL   Date Value Ref Range Status   01/12/2024 40  Final     LDL   Date Value Ref Range Status   01/12/2024 62  Final     Triglycerides   Date Value Ref Range Status   01/12/2024 110  Final      Blood Sugar Hemoglobin A1C   Date Value Ref Range Status   08/19/2021 5.6  Final     Glucose   Date Value Ref Range Status   01/12/2024 97  Final   12/12/2022 95 70 - 105 mg/dL Final   89/86/7977 861 70 - 105 mg/dL Final Glucose, POC   Date Value Ref Range Status   05/01/2024 178 (A) 70 - 100 Final   11/29/2023 105 (A) 70 - 100 Final   06/07/2023 166 (A) 70 - 100 Final         Problems Addressed Today  Encounter Diagnoses   Name Primary?    Cardiovascular  symptoms Yes       Assessment and Plan     Mr. Hair has developed exertional chest discomfort suggestive for new onset angina pectoris with episodes lasting approximately 15 minutes.  Alternatives for diagnosis and treatment were reviewed with the patient.  I have strongly recommended that we proceed with coronary angiography/intervention.  I do not believe that a stress test is necessary.  His episodes of chest discomfort are strictly exertional and his diagnosis would remain angina pectoris even if his stress test did not show objective evidence of myocardial ischemia.  I recommended more immediate coronary angiography/intervention since he has new onset symptoms which last for 15 minutes.  He is not having chest discomfort now.  I have requested hospitalization today for monitoring in preparation for coronary angiography/intervention tomorrow if his hospital team is in agreement.  I have asked him to restrict his activities until his exertional chest discomfort is further evaluated and treated.  He was given a prescription for sublingual nitroglycerin  with instructions for use and a prescription for carvedilol  3.125 mg twice daily for treatment of hypertension and for antianginal purposes.  He plans to have his wife drive him to the hospital.  I have asked him to return for follow-up in clinic in approximately 3 months so that I can follow his progress. The total time spent during this interview and exam with preparation and chart review was 60 minutes.         Current Medications (including today's revisions)   acetaminophen (TYLENOL EXTRA STRENGTH) 500 mg tablet Take two tablets by mouth as Needed for Pain. Max of 4,000 mg of acetaminophen in 24 hours.    ascorbic acid (VITAMIN C PO) Take 1 tablet by mouth daily.    aspirin EC 81 mg tablet Take 1 Tab by mouth daily. Take with food.    atorvastatin  (LIPITOR) 40 mg tablet TAKE 1 TABLET DAILY    carvediloL  (COREG ) 3.125 mg tablet Take one tablet by mouth twice daily with meals. Take with food.    finasteride (PROSCAR) 5 mg tablet Take one tablet by mouth daily.    hydroCHLOROthiazide  (HYDRODIURIL ) 25 mg tablet TAKE 1 TABLET DAILY    lancets 33 gauge (ONETOUCH DELICA 33 GUAGE) 33 gauge Use one each as directed twice daily as needed. E11.65  Collaborating MD:  Dr. R Karnchanasorn    losartan (COZAAR) 50 mg tablet Take one tablet by mouth daily.    meloxicam (MOBIC) 15 mg tablet Take one tablet by mouth as Needed.    metFORMIN -XR (GLUCOPHAGE  XR) 500 mg extended release tablet Take one tablet by mouth daily with dinner. TAKE AS INSTRUCTED BY YOUR PRESCRIBER  Indications: type 2 diabetes mellitus    nitroglycerin  (NITROSTAT ) 0.4 mg tablet Place one tablet under tongue every 5 minutes as needed for Chest Pain. Max of 3 tablets, call 911.    ONETOUCH VERIO TEST STRIPS test strip USE TWICE A DAY BEFORE MEALS AS DIRECTED    sertraline (ZOLOFT) 50 mg tablet TAKE ONE TABLET BY MOUTH ONCE DAILY    tamsulosin (FLOMAX) 0.4 mg capsule Take one capsule by mouth at bedtime daily.    tirzepatide  (MOUNJARO ) 7.5 mg/0.5 mL injector PEN Inject 0.5 mL under the skin every 7 days.    verapamil  CR (VERELAN ) 180 mg capsule Take one capsule by mouth daily.                 [1]   Social History  Tobacco Use   Smoking Status Never  Passive exposure: Never   Smokeless Tobacco Former    Quit date: 11/07/1996

## 2024-10-15 NOTE — Telephone Encounter [36]
 Patient called nursing line with concerns of chest pressure, shortness of breath and increased blood pressure for the past 2 weeks.  Patient had CABG in 2017. States for the past couple of weeks with exertion has chest tightness and pressure with shortness of breath that resolves with rest. He monitors his blood pressure a couple of times and week and bp has been running 170's/70-80's. Medication list reviewed and compliant.    Will have Larraine, RN review with SBG in clinic.

## 2024-10-16 ENCOUNTER — Encounter: Admit: 2024-10-16 | Discharge: 2024-10-16 | Payer: BLUE CROSS/BLUE SHIELD

## 2024-10-16 ENCOUNTER — Inpatient Hospital Stay: Admit: 2024-10-16 | Discharge: 2024-10-16 | Payer: BLUE CROSS/BLUE SHIELD

## 2024-10-16 ENCOUNTER — Inpatient Hospital Stay: Admit: 2024-10-16 | Discharge: 2024-10-15 | Payer: BLUE CROSS/BLUE SHIELD

## 2024-10-16 VITALS — BP 102/69 | HR 53 | Temp 97.70000°F | Ht 73.0 in | Wt 283.0 lb

## 2024-10-16 LAB — 2D + DOPPLER ECHO
AORTIC VALVE STROKE VOLUME INDEX: 38
AV INDEX (NATIVE): 0.7
AV PEAK VELOCITY: 1.5 m/s
BSA: 2.5 m2
DOP CALC LVOT AREA: 3.8 cm2
DOP CALC LVOT DIAMETER: 2.2 cm
DOP CALC LVOT PEAK VEL VTI: 24 cm
DOP CALC LVOT PEAK VEL: 1.1 m/s
DOP CALC LVOT STROKE VOLUME: 91 cm3
E WAVE DECELARTION TIME: 210 ms
E/A RATIO: 1.5
EJECTION FRACTION: 48 %
FRACTIONAL SHORTENING: 28 % (ref 28–44)
INTERVENTRICULAR SEPTUM: 1.2 cm (ref 0.6–1)
LATERAL E/E' RATIO: 8.7
LEFT ATRIUM INDEX: 21 mL/m2 (ref 16–34)
LEFT ATRIUM SIZE: 4.8 cm (ref 3–4)
LEFT ATRIUM VOLUME: 54 mL (ref 18–58)
LEFT INTERNAL DIMENSION IN SYSTOLE: 3.5 cm (ref 2.5–4)
LEFT VENTRICLE DIASTOLIC VOLUME INDEX: 54 mL/m2 (ref 34–74)
LEFT VENTRICLE DIASTOLIC VOLUME: 140 mL (ref 62–150)
LEFT VENTRICLE MASS INDEX: 88 g/m2 (ref 49–115)
LEFT VENTRICLE SYSTOLIC VOLUME INDEX: 19 mL/m2 (ref 11–31)
LEFT VENTRICLE SYSTOLIC VOLUME: 49 mL (ref 21–61)
LEFT VENTRICULAR INTERNAL DIMENSION IN DIASTOLE: 4.9 cm — ABNORMAL HIGH (ref 4.2–5.8)
LEFT VENTRICULAR MASS: 226 g (ref 88–224)
MEDIAL E/E' RATIO: 13
MV PEAK A VEL: 0.6 m/s
MV PEAK E VEL PW: 0.9 m/s
POSTERIOR WALL: 1.2 cm (ref 0.6–1)
PROX AORTA: 3.4 cm (ref 2.6–3.8)
RA PRESSURE: 3
RELATIVE WALL THICKNESS: 0.4 (ref <=0.42)
RIGHT ATRIAL AREA: 14 cm2 (ref <18)
RIGHT HEART SYSTOLIC MMODE TAPSE: 1.4 cm (ref >1.7)
RIGHT HEART SYSTOLIC TDI S': 0 m/s
RIGHT VENTRICULAR BASAL DIAMETER: 4.1 cm (ref 2.5–4.1)
RIGHT VENTRICULAR MID DIAMETER: 3.2 cm (ref 1.9–3.5)
SIMPSONS BIPLANE EF: 65 %
SINUS: 3.6 cm (ref 2.8–4)
TDI LATERAL E': 0.1 m/s
TDI MEDIAL E': 0 m/s

## 2024-10-16 LAB — CBC AND DIFF
~~LOC~~ BKR MPV: 8.7 fL (ref 7.0–11.0)
~~LOC~~ BKR RBC COUNT: 4.6 10*6/uL (ref 4.40–5.50)
~~LOC~~ BKR WBC COUNT: 7.6 10*3/uL (ref 4.50–11.00)

## 2024-10-16 LAB — COMPREHENSIVE METABOLIC PANEL
~~LOC~~ BKR ALBUMIN: 4.5 g/dL (ref 3.5–5.0)
~~LOC~~ BKR ALK PHOSPHATASE: 99 U/L (ref 25–110)
~~LOC~~ BKR AST: 18 U/L (ref 7–40)
~~LOC~~ BKR BLD UREA NITROGEN: 17 mg/dL (ref 7–25)
~~LOC~~ BKR CALCIUM: 9.6 mg/dL (ref 8.5–10.6)
~~LOC~~ BKR CHLORIDE: 101 mmol/L (ref 98–110)
~~LOC~~ BKR CREATININE: 0.9 mg/dL (ref 0.40–1.24)
~~LOC~~ BKR POTASSIUM: 3.6 mmol/L (ref 3.5–5.1)
~~LOC~~ BKR TOTAL PROTEIN: 7.4 g/dL (ref 6.0–8.0)

## 2024-10-16 LAB — ECG 12-LEAD
P AXIS: 58 degrees (ref 0.00–0.45)
P-R INTERVAL: 178 ms (ref 21–30)
Q-T INTERVAL: 432 ms (ref >60–4.80)
QRS DURATION: 104 ms (ref 3–12)
QTC CALCULATION (BAZETT): 424 ms (ref 0.00–0.80)
R AXIS: 58 degrees (ref 0.00–0.20)
T AXIS: 121 degrees
VENTRICULAR RATE: 58 {beats}/min (ref 7–56)

## 2024-10-16 LAB — POC GLUCOSE
~~LOC~~ BKR POC GLUCOSE: 87 mg/dL (ref 70–100)
~~LOC~~ BKR POC GLUCOSE: 78 mg/dL (ref 70–100)
~~LOC~~ BKR POC GLUCOSE: 78 mg/dL (ref 70–100)

## 2024-10-16 LAB — TSH WITH FREE T4 REFLEX: ~~LOC~~ BKR TSH: 3.2 [IU]/mL (ref 0.35–5.00)

## 2024-10-16 MED ORDER — PERFLUTREN LIPID MICROSPHERES 1.1 MG/ML IV SUSP
1-10 mL | Freq: Once | INTRAVENOUS | 0 refills | Status: AC | PRN
Start: 2024-10-16 — End: ?

## 2024-10-16 MED ORDER — POTASSIUM CHLORIDE 20 MEQ PO TBTQ
60 meq | Freq: Once | ORAL | 0 refills | Status: CP
Start: 2024-10-16 — End: ?
  Administered 2024-10-16: 13:00:00 60 meq via ORAL

## 2024-10-16 MED ORDER — ASPIRIN 81 MG PO CHEW
81 mg | Freq: Every day | ORAL | 0 refills | Status: DC
Start: 2024-10-16 — End: 2024-10-17
  Administered 2024-10-17: 14:00:00 81 mg via ORAL

## 2024-10-16 MED ORDER — ISOSORBIDE MONONITRATE 30 MG PO TB24
15 mg | Freq: Every evening | ORAL | 0 refills | Status: DC
Start: 2024-10-16 — End: 2024-10-17
  Administered 2024-10-17: 03:00:00 15 mg via ORAL

## 2024-10-16 MED ORDER — ONDANSETRON HCL (PF) 4 MG/2 ML IJ SOLN
4 mg | INTRAVENOUS | 0 refills | Status: DC | PRN
Start: 2024-10-16 — End: 2024-10-17

## 2024-10-16 MED ORDER — SODIUM CHLORIDE 0.9% IV SOLP
INTRAVENOUS | 0 refills | Status: AC
Start: 2024-10-16 — End: ?
  Administered 2024-10-16: 1000.0000 mL via INTRAVENOUS

## 2024-10-16 MED ORDER — DIPHENHYDRAMINE HCL 25 MG PO CAP
25 mg | ORAL | 0 refills | Status: DC | PRN
Start: 2024-10-16 — End: 2024-10-17

## 2024-10-16 MED ORDER — SODIUM CHLORIDE 0.9 % IJ SOLN
10 mL | Freq: Once | INTRAVENOUS | 0 refills | Status: CP
Start: 2024-10-16 — End: ?
  Administered 2024-10-16: 18:00:00 10 mL via INTRAVENOUS

## 2024-10-16 MED ORDER — PERFLUTREN LIPID MICROSPHERES 1.1 MG/ML IV SUSP
1-10 mL | Freq: Once | INTRAVENOUS | 0 refills | Status: CP | PRN
Start: 2024-10-16 — End: ?
  Administered 2024-10-16: 18:00:00 2 mL via INTRAVENOUS

## 2024-10-16 MED ORDER — SODIUM CHLORIDE 0.9 % IJ SOLN
10 mL | Freq: Once | INTRAVENOUS | 0 refills | Status: AC
Start: 2024-10-16 — End: ?

## 2024-10-16 MED ORDER — ENOXAPARIN 40 MG/0.4 ML SC SYRG
40 mg | Freq: Every day | SUBCUTANEOUS | 0 refills | Status: DC
Start: 2024-10-16 — End: 2024-10-17
  Administered 2024-10-17: 02:00:00 40 mg via SUBCUTANEOUS

## 2024-10-16 MED ORDER — DIPHENHYDRAMINE HCL 50 MG/ML IJ SOLN
25 mg | INTRAVENOUS | 0 refills | Status: DC | PRN
Start: 2024-10-16 — End: 2024-10-17

## 2024-10-16 NOTE — Case Mgmt DC Plan [600024]
 Case Management Admission Assessment    NAME:Jeremy Bullock                          MRN: [id number]             DOB:05/02/1961          AGE: 63 y.o.  ADMISSION DATE: 10/15/2024             DAYS ADMITTED: LOS: 1 day      Today?s Date: 10/16/2024    Source of Information: This CM met with pt for assessment on this date.  Provided contact information and explanation of SW/NCM roles.  Reviewed [facility] and Preparing for Discharge.  Provided opportunity for questions and discussion. Pt/family encouraged to contact Case Management team with questions and concerns during hospitalization and until patient is able to transition back to the patient's primary care physician.  -Patient and his wife live in their home in [place].  Patient works and is independent with no history of HH or DME use.        Plan  Plan: Assist PRN with SW/NCM Services:  CM team will follow for d/c needs.      Patient Address/Phone  [address]  [place] [address]  [phone] (home) [phone] (work)    Science Writer  Extended Emergency Contact Information  Primary Emergency Contact: Harvie, [place]   [place]   Home Phone: [phone]  Relation: Spouse    Healthcare Directive  Healthcare Directive: No, patient does not have a healthcare directive  Would patient like to fill out a (a new) Healthcare Directive?: No, patient declined  Psych Advance Directive (Psych unit only): No, patient does not have a Social Research Officer, Government  Does the Patient Need Case Management to Arrange Discharge Transport? (ex: facility, ambulance, wheelchair/stretcher, Medicaid, cab, other): No  Will the Patient Use Family Transport?: Yes  Transportation Name, Phone and Availability #1: wife, [place]    Expected Discharge Date  10/17/2024 2:00 PM    Living Situation Prior to Admission  Living Arrangements  Type of Residence: Home, independent  Living Arrangements: Spouse/significant other  Financial Risk Analyst / Tub: Psychologist, Counselling  How many levels in the residence?: 1  Can patient live on one level if needed?: Yes  Does residence have entry and/or inside stairs?: Yes (2 plus has ramp)  Assistance needed prior to admit or anticipated on discharge: No  Level of Function   Prior level of function: Independent  Cognitive Abilities   Cognitive Abilities: Alert and Oriented, Participates in decision making, Engages in problem solving and Pensions Consultant  Primary Insurance: Nurse, learning disability  Secondary Insurance: No insurance  Additional Coverage: None  Medication Coverage    Medication Coverage: Commercial insurance  Have you experienced a noticeable increase in your copay costs recently?: No  Are current medications affordable?: Yes  Do You Use a Co-Pay Card or a Medication Assistance Program to Help Manage Medication Costs?: No  Do You Manage Your Own Medications?: Yes  Source of Income   Source Of Income: Employed  Financial Assistance Needed?  no    Psychosocial Needs  Mental Health  Mental Health History: No  Substance Use History  Substance Use History Screen: No  Social Determinants of Health (SDOH):  Other  no    Current/Previous Services  PCP  [place], [place], [phone], [phone]  Pharmacy    [facility] [address] [place] - [address]  [address]  [place] [address]  Phone: [phone] Fax: [phone]    [facility] - [place], [place] - [address]  [address]  [place] [address]  Phone: [phone] Fax: [phone] Alternate Fax: [phone]    [facility] [place], [place] - [address]  [address]  [place] [address]  Phone: [phone] Fax: [phone]    Durable Medical Equipment   Durable Medical Equipment at home: CPAP/BiPAP  Home Health  Receiving home health: In the past  Agency name: doesn't remember  Hemodialysis or Peritoneal Dialysis  Undergoing hemodialysis or peritoneal dialysis: No  Tube/Enteral Feeds  Receive tube/enteral feeds: No  Infusion  Receive infusions: In the past  Where: home  Infusion company: doesn't remember  Private Duty  Private duty help used: No  Home and Community Based [employer] and community based services: No  Ryan White  Ryan White: N/A  Hospice  Hospice: No  Outpatient Therapy  PT: No  OT: No  SLP: No  Skilled Nursing Facility/Nursing Home  SNF: No  NH: No  Inpatient Rehab  IPR: No  Long-Term Acute Care Hospital  LTACH: No  Acute Hospital Stay  Acute Hospital Stay: In the past  Was patient's stay within the last 30 days?: No      [place] Lennie PEAK, CCM  Integrated Case Manager  [phone]

## 2024-10-16 NOTE — Progress Notes [1]
 RT Adult Assessment Note    NAME:Jeremy Bullock             MRN: [id number]             DOB:1961-03-20          AGE: 63 y.o.  ADMISSION DATE: 10/15/2024             DAYS ADMITTED: LOS: 1 day    Additional Comments:  Impressions of the patient: pt resting well on his CPAP. NAD noted. Pt will self-place his home unit. No other pulmonary issues reported.   Intervention(s)/outcome(s): RT assessment.   Patient education that was completed: none      Vital Signs:  Pulse: 58  RR: 18 PER MINUTE  SpO2: 97 %  O2 Device: CPAP/BiPAP  Liter Flow:    O2%:      Breath Sounds:   Breath Sounds WDL: Within Defined Limits  Right Base Breath Sounds: Decreased  Left Base Breath Sounds: Decreased  Respiratory Effort:   Respiratory WDL: Within Defined Limits  Respiratory Effort/Pattern: SOA (Short of air) on exertion  Comments:

## 2024-10-16 NOTE — Progress Notes [1]
 Cardiology Daily Progress Note      Patient's Name:  Jeremy Bullock MRN: [id number]   Today's Date:  10/16/2024  Admission Date: 10/15/2024  LOS: 1 day    Problem list  Principal Problem:    Angina pectoris, unspecified  Active Problems:    Essential (primary) hypertension    History of tobacco use    Dyslipidemia    Type 2 diabetes mellitus (CMS-HCC)    Coronary artery disease due to calcified coronary lesion      Assessment and Plan:     Jeremy Bullock is a 63 y.o. 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      10/16/2024 Updates:  - Plan for LHC later today  - TTE completed with EF of 65% and no RWMA's  Assessment & Plan  Angina pectoris, unspecified    Essential (primary) hypertension    History of tobacco use    Dyslipidemia    Type 2 diabetes mellitus (CMS-HCC)    Coronary artery disease due to calcified coronary lesion      #CAD s/p CABG x 3 (12/2015)  #Exertional Angina   #HLD  #HTN  - Describes 3 weeks of worsening anginal symptoms associated with exertion  - Most recent TTE from 07/2023 with EF 60-65%, no RWMA's  - S/p CABG x 3 (12/2015)  - EKG: HR 58, presence of PACs   - Most recent lipid profile from 01/2024 with LDL 62 and total cholesterol 124  - PTA meds: ASA 81, atorvastatin  40 mg, Coreg  3.125 mg twice daily, HCTZ 25 mg, losartan  50 mg, verapamil  180 mg daily  - BNP 45, TSH 3.23, random troponin 6.5, lipid profile with LDL 50  PLAN:  > LHC planned for later 12/10  > No indication for heparin gtt at this time   > Continue PTA aspirin  81 mg  > Continue PTA atorvastatin  40 mg daily  > Continue PTA Coreg  3.125 mg twice daily  > Continue PTA verapamil  180 mg  > Continue PTA losartan  50 mg daily  > Holding PTA HCTZ 25 mg daily     #T2DM  #Obesity  - Most recent A1c 6.0 from 04/2024  - PTA on metformin  500 mg daily and tirzepatide  7.5 weekly  - Repeat A1c of 5.6 on 12/10  PLAN:  > Accu-Cheks ACHS  > LDCF  > Holding PTA metformin  and Tirzepatide  while admitted     #OSA  PLAN:  > CPAP therapy     #BPH  PLAN:  > Continue PTA Finasteride  5 mg QDAY   > Continue PTA Tamsulosin  0.4 mg QHS     #Mood Disorder  PLAN:  > Continue PTA Sertraline  50 mg QDAY    Diet: DIET NPO AT MIDNIGHT Sips With Medications  VTE ppx: Lovenox   PT/OT: consulted; recommendation: pending  CODE: Full Code    DISPO: Continue admission to Cardiology. Likely to discharge 12/11 home pending cath results    Seen and discussed with Dr. Valentina Thersia Journey, DO  Internal Medicine, PGY-2  Available on Voalte      Subjective:     No acute overnight events.  He says that he is doing well this morning.  Denies any chest pain, shortness of breath, palpitations, abdominal pain, or other acute concerns this  morning.    Brief Hospital Course:     Jeremy Bullock is a 63 y.o. 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    On admission, vital signs were significant for hypertension but otherwise stable.  Significant labs include a normal BNP, TSH, troponin, and lipid profile.  A1c was repeated and found to be 5.6.  An echocardiogram was obtained and showed an EF of 65% without evidence of regional wall motion abnormalities.  He was taken for a left heart catheterization on 10/16/2024 evening.    Objective:   Vital Signs:  BP 137/64 (BP Source: Arm, Left Upper)  - Pulse 53  - Temp 36.3 ?C (97.3 ?F)  - Ht 185.4 cm (6' 1)  - Wt 128 kg (282 lb 3 oz)  - SpO2 98%  - BMI 37.23 kg/m?     Intake/Output Summary:  (Last 24 hours)    Intake/Output Summary (Last 24 hours) at 10/16/2024 0619  Last data filed at 10/16/2024 0315  Gross per 24 hour   Intake 480 ml   Output 900 ml   Net -420 ml           Physical Exam  Vitals and nursing note reviewed.   Constitutional:       Appearance: Normal appearance.   HENT:      Head: Normocephalic and atraumatic.      Right Ear: External ear normal.      Left Ear: External ear normal.      Mouth/Throat:      Mouth: Mucous membranes are moist.   Eyes:      Extraocular Movements: Extraocular movements intact.      Conjunctiva/sclera: Conjunctivae normal.   Cardiovascular:      Rate and Rhythm: Normal rate.      Pulses: Normal pulses.   Pulmonary:      Effort: Pulmonary effort is normal. No respiratory distress.      Breath sounds: No wheezing.   Abdominal:      General: There is no distension.      Tenderness: There is no abdominal tenderness.   Musculoskeletal:         General: Normal range of motion.      Cervical back: Normal range of motion.      Right lower leg: No edema.      Left lower leg: No edema.   Skin:     General: Skin is warm.   Neurological:      General: No focal deficit present.      Mental Status: He is alert and oriented to person, place, and time.   Psychiatric:         Mood and Affect: Mood normal.         Behavior: Behavior normal.         Thought Content: Thought content normal.         Judgment: Judgment normal.       Labs:  Recent Labs     10/15/24  2059 10/16/24  0335   HGB 14.9 14.1   WBC 7.60 5.90   PLTCT 168 154   NA 137 139   K 3.6 3.4*   CL 101 102   CO2 27 29   BUN 17 15   CR 0.92 0.98   GLU 100 97   CA  9.6 9.3   MG 2.2 2.2   ALBUMIN 4.5  --    AST 18  --    ALT 25  --    ALKPHOS 99  --    TOTBILI 0.7  --      Meds:  Scheduled Meds:ascorbic acid  (vitamin C ) (C-500) tablet 250 mg, 250 mg, Oral, QDAY  aspirin  EC (ASPIR-LOW) tablet 81 mg, 81 mg, Oral, QDAY  aspirin  tablet 325 mg, 325 mg, Oral, ONCE  atorvastatin  (LIPITOR) tablet 40 mg, 40 mg, Oral, QDAY  carvediloL  (COREG ) tablet 3.125 mg, 3.125 mg, Oral, BID w/meals  finasteride  (PROSCAR ) tablet 5 mg, 5 mg, Oral, QDAY  [Held by Provider] hydroCHLOROthiazide  (HYDRODIURIL ) tablet 25 mg, 25 mg, Oral, QDAY  insulin  aspart (U-100) (NOVOLOG  FLEXPEN U-100 INSULIN ) injection PEN 0-6 Units, 0-6 Units, Subcutaneous, ACHS (22)  [Held by Provider] losartan  (COZAAR ) tablet 50 mg, 50 mg, Oral, QDAY  sertraline  (ZOLOFT ) tablet 50 mg, 50 mg, Oral, QDAY  sodium chloride  0.9% IV bolus 500 mL, 500 mL, Intravenous, ONCE  tamsulosin  (FLOMAX ) capsule 0.4 mg, 0.4 mg, Oral, QHS  verapamil  SR (CALAN  SR) tablet 180 mg, 180 mg, Oral, QDAY    Continuous Infusions:  PRN and Respiratory Meds:acetaminophen  Q6H PRN, dextrose  50% PRN, melatonin QHS PRN, nitroglycerin  Q5 MIN PRN      Wounds:                                                Nutrition:  Malnutrition Details:

## 2024-10-16 NOTE — Progress Notes [1]
 HC4 END OF SHIFT NOTE    Admission Date: 10/15/2024  Length of Stay: LOS: 1 day    Acute events, nursing interventions, and communication with providers:   Denies CP  UOP adeqaute    Activity: Walk x2      If Other, please explain:     Incentive Spirometer this shift: No  Max volume: 0    Cardiac Rhythm: SB/SA     Intake and Output:        Intake/Output Summary (Last 24 hours) at 10/16/2024 1831  Last data filed at 10/16/2024 1222  Gross per 24 hour   Intake 513.33 ml   Output 1400 ml   Net -886.67 ml              Last Bowel Movement Date:  (PTA)        Patient Education  Quality/Safety Education: Fall risk and Pain scale   Cardiac - Specific Education: Post cardiac procedure care: LHC    Other Patient education provided:   Learners: Patient and Family  Method/materials used: Astronomer  Response to learning: Tax Inspector and Demonstrated Understanding  Needs reinforcement on:     Patient Goal(s):  Patient will Improve their nutritional intake   and Pain will be </= 4 this shift  by discharge date         Patient will  Have BM  , Improve their nutritional intake  , and Maintain stable fluid volume with clear breath sounds and vital signs within normal limit  by discharge date   Other:    Restraints:  No     Restraint Goal: Patient will be free from injury while physically restrained.  See Docflowsheet for restraint documentation, interventions, education, etc.

## 2024-10-17 ENCOUNTER — Inpatient Hospital Stay
Admission: EM | Admit: 2024-10-16 | Discharge: 2024-10-17 | Disposition: A | Discharge status: Home / Self Care | Payer: BLUE CROSS/BLUE SHIELD | Attending: Student in an Organized Health Care Education/Training Program | Admitting: Student in an Organized Health Care Education/Training Program

## 2024-10-17 DIAGNOSIS — E785 Hyperlipidemia, unspecified: Secondary | ICD-10-CM

## 2024-10-17 DIAGNOSIS — Z8249 Family history of ischemic heart disease and other diseases of the circulatory system: Secondary | ICD-10-CM

## 2024-10-17 DIAGNOSIS — G4733 Obstructive sleep apnea (adult) (pediatric): Secondary | ICD-10-CM

## 2024-10-17 DIAGNOSIS — Z87891 Personal history of nicotine dependence: Secondary | ICD-10-CM

## 2024-10-17 DIAGNOSIS — Z7985 Long-term (current) use of injectable non-insulin antidiabetic drugs: Secondary | ICD-10-CM

## 2024-10-17 DIAGNOSIS — I10 Essential (primary) hypertension: Secondary | ICD-10-CM

## 2024-10-17 DIAGNOSIS — Z833 Family history of diabetes mellitus: Secondary | ICD-10-CM

## 2024-10-17 DIAGNOSIS — Z885 Allergy status to narcotic agent status: Secondary | ICD-10-CM

## 2024-10-17 DIAGNOSIS — N400 Enlarged prostate without lower urinary tract symptoms: Secondary | ICD-10-CM

## 2024-10-17 DIAGNOSIS — Z83438 Family history of other disorder of lipoprotein metabolism and other lipidemia: Secondary | ICD-10-CM

## 2024-10-17 DIAGNOSIS — Z7982 Long term (current) use of aspirin: Secondary | ICD-10-CM

## 2024-10-17 DIAGNOSIS — I25118 Atherosclerotic heart disease of native coronary artery with other forms of angina pectoris: Secondary | ICD-10-CM

## 2024-10-17 DIAGNOSIS — Z7984 Long term (current) use of oral hypoglycemic drugs: Secondary | ICD-10-CM

## 2024-10-17 DIAGNOSIS — Z951 Presence of aortocoronary bypass graft: Secondary | ICD-10-CM

## 2024-10-17 DIAGNOSIS — I2584 Coronary atherosclerosis due to calcified coronary lesion: Secondary | ICD-10-CM

## 2024-10-17 DIAGNOSIS — Z79899 Other long term (current) drug therapy: Secondary | ICD-10-CM

## 2024-10-17 DIAGNOSIS — F39 Unspecified mood [affective] disorder: Secondary | ICD-10-CM

## 2024-10-17 DIAGNOSIS — E119 Type 2 diabetes mellitus without complications: Secondary | ICD-10-CM

## 2024-10-17 LAB — POC GLUCOSE
~~LOC~~ BKR POC GLUCOSE: 97 mg/dL (ref 70–100)
~~LOC~~ BKR POC GLUCOSE: 113 mg/dL — ABNORMAL HIGH (ref 70–100)
~~LOC~~ BKR POC GLUCOSE: 90 mg/dL (ref 70–100)

## 2024-10-17 MED ORDER — SPIRONOLACTONE 25 MG PO TAB
25 mg | Freq: Every day | ORAL | 0 refills | Status: DC
Start: 2024-10-17 — End: 2024-10-17
  Administered 2024-10-17: 18:00:00 25 mg via ORAL

## 2024-10-17 MED ORDER — SPIRONOLACTONE 25 MG PO TAB
25 mg | ORAL_TABLET | Freq: Every day | ORAL | 0 refills | 90.00000 days | Status: AC
Start: 2024-10-17 — End: ?

## 2024-10-17 MED ORDER — HYDROCHLOROTHIAZIDE 25 MG PO TAB
25 mg | Freq: Every day | ORAL | 0 refills | Status: DC
Start: 2024-10-17 — End: 2024-10-17

## 2024-10-17 MED ORDER — FUROSEMIDE 10 MG/ML IJ SOLN
20 mg | Freq: Once | INTRAVENOUS | 0 refills | Status: CP
Start: 2024-10-17 — End: ?
  Administered 2024-10-17: 15:00:00 20 mg via INTRAVENOUS

## 2024-10-17 MED ORDER — RANOLAZINE 500 MG PO TB12
500 mg | ORAL_TABLET | Freq: Two times a day (BID) | ORAL | 0 refills | 30.00000 days | Status: AC
Start: 2024-10-17 — End: ?

## 2024-10-17 MED ORDER — SPIRONOLACTONE-HYDROCHLOROTHIAZIDE 25-25 MG TAB
Freq: Every day | ORAL | 0 refills | Status: DC
Start: 2024-10-17 — End: 2024-10-17

## 2024-10-17 MED ORDER — ISOSORBIDE MONONITRATE 30 MG PO TB24
15 mg | ORAL_TABLET | Freq: Every evening | ORAL | 0 refills | Status: CN
Start: 2024-10-17 — End: ?

## 2024-10-17 MED ORDER — RANOLAZINE 500 MG PO TB12
500 mg | Freq: Two times a day (BID) | ORAL | 0 refills | Status: DC
Start: 2024-10-17 — End: 2024-10-17
  Administered 2024-10-17: 18:00:00 500 mg via ORAL

## 2024-10-17 NOTE — Progress Notes [1]
 HC4 END OF SHIFT NOTE    Admission Date: 10/15/2024  Length of Stay: LOS: 2 days    Acute events, nursing interventions, and communication with providers:   Assumed care @ 2014  Alert and oriented x4.  Tolerating RA.  UOP adequate this shift. -BM this shift.  Surgical incision C/D/I  Pain managed on current regimen.  VSS per pt trend  Pt ambulating standby assist. Fall bundle in place.     Activity: Other       If Other, please explain:       Cardiac Rhythm: SB    Intake and Output:        Intake/Output Summary (Last 24 hours) at 10/17/2024 9374  Last data filed at 10/17/2024 0315  Gross per 24 hour   Intake 153.33 ml   Output 500 ml   Net -346.67 ml              Last Bowel Movement Date:  (PTA)        Patient Education  Quality/Safety Education: Fall risk and Pain scale   Cardiac - Specific Education: Cardiac diet/nutrition    Other Patient education provided:   Learners: Patient  Method/materials used: Verbal teaching  Response to learning: [employer] Understanding  Needs reinforcement on:     Patient Goal(s):  Patient will Be able to ambulate without breathing difficulty  and Report progressive increase in activity tolerance  by discharge date         Patient will  Pain will be </= 4 this shift  by the end of shift   Other:    Restraints:  No     Restraint Goal: Patient will be free from injury while physically restrained.  See Docflowsheet for restraint documentation, interventions, education, etc.

## 2024-10-17 NOTE — Progress Notes [1]
 OCCUPATIONAL THERAPY  NOTE   Name: Jeremy Bullock   MRN: [id number]     DOB: [birth year]      Age: 63 y.o.  Admission Date: 10/15/2024     LOS: 2 days     Date of Service: 10/17/2024    Per discussion with PT, patient reports no concerns with completing activities of daily living with no OT goals identified. OT will discontinue service, please re-consult if the patient has a decline in functional status.      Therapist: Marycatherine Maniscalco, OTD, OTR/L  Date: 10/17/2024

## 2024-10-17 NOTE — Progress Notes [1]
 Discharge information reviewed, all questions/concerns addressed, patient escorted to main entrance.

## 2024-10-17 NOTE — Progress Notes [1]
 PHYSICAL THERAPY  ASSESSMENT/DISCHARGE NOTE      Name: Jeremy Bullock   MRN: [id number]     DOB: 18-Feb-1961      Age: 63 y.o.  Admission Date: 10/15/2024     LOS: 2 days     Date of Service: 10/17/2024      Mobility  Patient Turn/Position: Self  Mobility Level [facility] Highest Level of Mobility (JH-HLM): Walk 250 feet or more  Distance Walked (feet): 720 ft  Level of Assistance: Stand by assistance  Assistive Device: None  Activity Limited By: No limitations    Subjective  Reason for Admission and Past Medical Hx: 63 y.o. 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  Mental / Cognitive: Alert;Oriented;Cooperative;Follows commands  Pain: No complaint of pain  Pain Interventions: Patient assisted into position of comfort  Persons Present: Spouse    Home Living Situation  Lives With: Spouse/significant other  Type of Home: House  Entry Stairs: 2  In-Home Stairs: No stairs  Bathroom Setup: Full bathroom on main living level  Patient Owned Equipment: None    Prior Level of Function  Level Of Independence: Independent with ADL and community mobility without device  History of Falls in Past 3 Months: No  Occupation/Education: Employed  Comments: Works in insurance account manager at a [employer]    ROM  R UE ROM: WFL   R UE ROM Method: Active  L UE ROM: WFL   L UE ROM Method: Active  R LE ROM: WFL  R LE ROM Method: Active  L LE ROM: WFL  L LE ROM Method: Active    Strength  Overall Strength: WFL    Bed Mobility/Transfer  Bed Mobility: Supine to Sit: Independent  Bed Mobility: Sit to Supine: Independent  Transfer Type: Sit to/from Stand  Transfer: Assistance Level: To/From;Bed;Independent  Transfer: Assistive Device: None  End Of Activity Status: In Bed    Gait  Gait Distance: 720 feet  Gait: Assistance Level: Independent  Gait: Assistive Device: None  Gait: Descriptors: Pace: Normal;Normal step length;No balance loss  Activity Limited By: Patient Choice    Education  Persons Educated: Patient/Family  Patient Barriers To Learning: None Noted  Teaching Methods: Verbal Instruction  Patient Response: Verbalized Understanding  Topics: Mobility Progression;Importance of Increasing Activity;Ambulate With Nursing    Assessment/Progress  Assessment/Progress: Discontinue PT    AM-PAC 6 Clicks Basic Mobility Inpatient  Turning from your back to your side while in a flat bed without using bed rails: None  Moving from lying on your back to sitting on the side of a flat bed without using bedrails : None  Moving to and from a bed to a chair (including a wheelchair): None  Standing up from a chair using your arms (e.g. wheelchair, or bedside chair): None  To walk in hospital room: None  Climbing 3-5 steps with a railing: None  Basic Mobility Inpatient Raw Score: 24  Standardized (T-scale) Score: 57.68  Mobility Goal [facility]: JH-HLM 8 Walk >= 250 ft    Plan  Plan Frequency: No Further Treatment    PT Discharge Recommendations  Recommendation: Home/prior living situation  Patient Currently Requires Equipment: None    Therapist  Mariel Endo, PT, DPT  Date  10/17/2024

## 2024-10-17 NOTE — Progress Notes [1]
 Order received on patient for Outpatient Cardiac Rehabilitation.  Reviewed patient?s chart; however, the patient unfortunately does not qualify for Cardiac Rehab services at this time due to not having a qualifying diagnosis. No Cardiac Rehabilitation information given at this time.  Should the patient?s status change, or should they become eligible for Cardiac Rehabilitation in the future, please do not hesitate to contact the Cardiopulmonary Rehabilitation Department in the future.  Thank you.     Qualifying diagnosis requirements for participation in Outpatient Cardiac Rehabilitation include NSTEMI, STEMI, PCI/Stent/Angioplasty, CABG, Valve Repair/replacement, Heart Transplant, Stable Angina, and CHF with EF < 35%.

## 2024-10-17 NOTE — Discharge Summary [5]
 Discharge Summary      Name: Jeremy Bullock  Medical Record Number: [id number]        Account Number:  [id number]  Date Of Birth:  07-03-1961                         Age:  63 y.o.  Admit date:  10/15/2024                     Discharge date: 10/17/2024      Discharge Attending:  Dr. Dehkordi  Discharge Summary Completed By: Quintin Kalata, DO    Service: Cardiology Service - 2634    Reason for hospitalization:  Angina pectoris, unspecified [I20.9]    Primary Discharge Diagnosis:   Angina pectoris, unspecified    Hospital Diagnoses:  Hospital Problems        Active Problems    * (Principal) Angina pectoris, unspecified    Essential (primary) hypertension    History of tobacco use    Dyslipidemia    Type 2 diabetes mellitus (CMS-HCC)    Coronary artery disease due to calcified coronary lesion     Present on Admission:   Essential (primary) hypertension   Dyslipidemia   Type 2 diabetes mellitus (CMS-HCC)   Coronary artery disease due to calcified coronary lesion   History of tobacco use   Angina pectoris, unspecified        Significant Past Medical History        Anxiety  Coronary artery disease  Depression  DM (diabetes mellitus) (CMS-HCC)  Dyslipidemia  Essential (primary) hypertension  History of tobacco use  Obstructive sleep apnea  Sleep apnea    Allergies   Percocet [oxycodone-acetaminophen ]    Brief Hospital Course     Patient is a 63 year old male past medical history significant for 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  Initial cardiac workup was reassuring with NT-proBNP and initial troponin within normal limits.  EKG demonstrated atrial bigeminy with T wave inversion similar to prior studies.  LDL was 50 on PTA regimen.  Echocardiogram was performed demonstrating EF of 65% with no regional wall motion abnormalities.  The patient underwent left heart catheterization on 12/10 demonstrating severe two-vessel coronary disease 80% second diagonal, 50-60% third diagonal, left circumflex 90% and 100% occluded mid to proximal segment. Otherwise, grafts are patent. No PCI was performed with plans for aggressive metabolic and lifestyle changes.  Overall presentation consistent with stable angina. Imdur  was trialed in which the patient endorsed HA symptoms. Since coreg  was just initiated, imdur  was discontinued and ranolazine  started. BP was lower end of normal with systolics 100's    Ranolazine  500 mg BID,  spironolactone /HCTZ 25-25 started and follow up appointment requested.     Day of discharge exam notable for:     Physical Exam  Constitutional:       General: He is not in acute distress.     Appearance: Normal appearance. He is not ill-appearing.   Eyes:      Extraocular Movements: Extraocular movements intact.      Conjunctiva/sclera: Conjunctivae normal.   Cardiovascular:      Rate and Rhythm: Normal rate and regular rhythm.   Pulmonary:      Effort: Pulmonary effort is normal. No respiratory distress.   Abdominal:      General: Abdomen is flat. There is no distension.      Palpations: Abdomen  is soft.      Tenderness: There is no abdominal tenderness. There is no guarding.   Musculoskeletal:         General: No swelling. Normal range of motion.      Cervical back: Normal range of motion.      Comments: Left cath site stable with no ecchymosis    Skin:     Capillary Refill: Capillary refill takes less than 2 seconds.   Neurological:      General: No focal deficit present.      Mental Status: He is alert and oriented to person, place, and time.          Items Needing Follow Up   Pending items or areas that need to be addressed at follow up:     -1 week lab follow up for BMP since starting diuretic    Pending Labs and Follow Up Radiology    Pending labs and/or radiology review at this time of discharge are listed below: Please note- any labs with collected status will not have a result; if this area is blank, there are no items for review.         Medications Medication List        START taking these medications      ranolazine  ER 500 mg tablet  Commonly known as: RANEXA   Dose: 500 mg  Take one tablet by mouth twice daily. Indications: prevention of anginal chest pain associated with coronary artery disease  For: prevention of anginal chest pain associated with coronary artery disease  Quantity: 180 tablet  Refills: 0     spironolactone  25 mg tablet  Commonly known as: ALDACTONE   Dose: 25 mg  Take one tablet by mouth daily. Take with food.  Indications: high blood pressure  For: high blood pressure  Quantity: 90 tablet  Refills: 0            CONTINUE taking these medications      acetaminophen  500 mg tablet  Commonly known as: TYLENOL  EXTRA STRENGTH  Dose: 1,000 mg  Take two tablets by mouth every 6 hours as needed for Pain. Max of 4,000 mg of acetaminophen  in 24 hours.  Refills: 0     ascorbic acid  (vitamin C ) 250 mg tablet  Dose: 250 mg  Take one tablet by mouth daily.  Refills: 0     aspirin  EC 81 mg tablet  Commonly known as: ASPIR-LOW  Dose: 81 mg  Take 1 Tab by mouth daily. Take with food.  Quantity: 90 Tab  Refills: 3     atorvastatin  40 mg tablet  Commonly known as: LIPITOR  Dose: 40 mg  TAKE 1 TABLET DAILY  Quantity: 90 tablet  Refills: 3     carvediloL  3.125 mg tablet  Commonly known as: COREG   Dose: 3.125 mg  Take one tablet by mouth twice daily with meals. Take with food.  Quantity: 180 tablet  Refills: 1     finasteride  5 mg tablet  Commonly known as: PROSCAR   Dose: 5 mg  Take one tablet by mouth daily.  Refills: 0     hydroCHLOROthiazide  25 mg tablet  Commonly known as: HYDRODIURIL   Dose: 25 mg  TAKE 1 TABLET DAILY  Quantity: 90 tablet  Refills: 3     lancets 33 gauge 33 gauge  Commonly known as: ONETOUCH DELICA LANCETS  Dose: 1 each  Doctor's comments: e11.65  Use one each as directed twice daily as needed. E11.65  Collaborating MD:  Dr.  R Karnchanasorn  Quantity: 200 each  Refills: 3     losartan  50 mg tablet  Commonly known as: COZAAR   Dose: 50 mg  Take one tablet by mouth daily.  Refills: 0     metFORMIN -XR 500 mg extended release tablet  Commonly known as: GLUCOPHAGE  XR  Dose: 500 mg  Take one tablet by mouth daily with dinner. TAKE AS INSTRUCTED BY YOUR PRESCRIBER  Indications: type 2 diabetes mellitus  For: type 2 diabetes mellitus  Quantity: 270 tablet  Refills: 3     MOUNJARO  7.5 mg/0.5 mL injector PEN  Generic drug: tirzepatide   Dose: 7.5 mg  Inject 0.5 mL under the skin every 7 days.  Quantity: 6 mL  Refills: 3     MULTIVITAMIN & MINERAL FORMULA PO  Dose: 1 tablet  Take 1 tablet by mouth daily.  Refills: 0     nitroglycerin  0.4 mg tablet  Commonly known as: NITROSTAT   Dose: 0.4 mg  Place one tablet under tongue every 5 minutes as needed for Chest Pain. Max of 3 tablets, call 911.  Quantity: 25 tablet  Refills: 3     ONETOUCH VERIO TEST STRIPS test strip  Generic drug: blood sugar diagnostic  USE TWICE A DAY BEFORE MEALS AS DIRECTED  For diagnoses: Diabetes mellitus without complication (CMS-HCC)  Quantity: 200 strip  Refills: 3     sertraline  50 mg tablet  Commonly known as: ZOLOFT   TAKE ONE TABLET BY MOUTH ONCE DAILY  Quantity: 90 Tab  Refills: 3     tamsulosin  0.4 mg capsule  Commonly known as: FLOMAX   Dose: 1 capsule  Take one capsule by mouth at bedtime daily.  Refills: 0     verapamil  CR 180 mg capsule  Commonly known as: VERELAN   Dose: 180 mg  Take one capsule by mouth daily.  Refills: 0            STOP taking these medications      meloxicam 15 mg tablet  Commonly known as: MOBIC               Where to Get Your Medications        These medications were sent to [id number] - [place], [place] - [address]  [address], [place] [address]      Phone: [phone]   ranolazine  ER 500 mg tablet  spironolactone  25 mg tablet         Return Appointments and Scheduled Appointments     Scheduled appointments:      Oct 23, 2024 8:30 AM  Office visit with Ulanda JONELLE Melia, APRN-NP  Endocrinology: [facility] (Internal Medicine) [address]  [address]  [place] [address]  [phone]     Dec 10, 2024 2:00 PM  Office visit with Elspeth KATHEE Alexander, MD  Cardiovascular Medicine: [facility] (CVM Exam) [address]  [address], [address]  [place] [address]  [phone]     Feb 27, 2025 1:00 PM  Office visit with Elspeth KATHEE Alexander, MD  Cardiovascular Medicine: [facility] (CVM Exam) [address]  [address]  [place] [address]  [phone]            Consults, Procedures, Diagnostics, Micro, Pathology   Consults: Interventional cardiology  Surgical Procedures & Dates: Left heart catheterization 12/10  Significant Diagnostic Studies, Micro and Procedures: none  Significant Pathology: none  Discharge Disposition, Condition   Patient Disposition:   Condition at Discharge: Stable    Code Status   Full Code    Patient Instructions     Activity       Activity as Tolerated   As directed      It is important to keep increasing your activity level after you leave the hospital.  Moving around can help prevent blood clots, lung infection (pneumonia) and other problems.  Gradually increasing the number of times you are up moving around will help you return to your normal activity level more quickly.  Continue to increase the number of times you are up to the chair and walking daily to return to your normal activity level. Begin to work towards your normal activity level at discharge.          Diet       Cardiac Diet   As directed      Limiting unhealthy fats and cholesterol is the most important step you can take in reducing your risk for cardiovascular disease.  Unhealthy fats include saturated and trans fats.  Monitor your sodium and cholesterol intake.  Restrict your sodium to 2g (grams) or 2000mg  (milligrams) daily, and your cholesterol to 200mg  daily.    If you have questions regarding your diet at home, you may contact a dietitian at [phone].             Discharge education provided to patient. and Signs and Symptoms:   Report these signs and symptoms       Report These Signs and Symptoms   As directed      Please contact your doctor if you have any of the following symptoms: Chest pain, shortness of breath, lightheadedness, dizziness, near fainting, palpitations, abd pain, back pain, or bleeding.            Additional Orders: Case Management, Supplies, Home Health     Home Health/DME       None            Signed:  Quintin Kalata, DO  10/17/2024      cc:  Primary Care Physician:  Kyung Goodell       Referring physicians:  No ref. provider found   Additional provider(s):        Did we miss something? If additional records are needed, please fax a request on office letterhead to [phone]. Please include the patient's name, date of birth, fax number and type of information needed. Additional request can be made by email at [email or web address]. For general questions of information about electronic records sharing, call [phone].

## 2024-10-22 LAB — BASIC METABOLIC PANEL
ANION GAP: 12
BLD UREA NITROGEN: 13
CALCIUM: 9.7
CHLORIDE: 100
CO2: 25
CREATININE: 1.2
GFR ESTIMATED: 68
GLUCOSE,PANEL: 94
POTASSIUM: 4.1
SODIUM: 137

## 2024-10-22 LAB — MAGNESIUM: MAGNESIUM: 2.2

## 2024-10-23 ENCOUNTER — Ambulatory Visit: Admit: 2024-10-23 | Discharge: 2024-10-24 | Payer: BLUE CROSS/BLUE SHIELD

## 2024-10-23 ENCOUNTER — Encounter: Admit: 2024-10-23 | Discharge: 2024-10-23 | Payer: BLUE CROSS/BLUE SHIELD

## 2024-10-23 VITALS — BP 116/68 | HR 58 | Temp 97.70000°F | Ht 73.0 in | Wt 277.0 lb

## 2024-10-23 DIAGNOSIS — I209 Angina pectoris, unspecified: Secondary | ICD-10-CM

## 2024-10-23 DIAGNOSIS — I10 Essential (primary) hypertension: Secondary | ICD-10-CM

## 2024-10-23 DIAGNOSIS — I2510 Atherosclerotic heart disease of native coronary artery without angina pectoris: Principal | ICD-10-CM

## 2024-10-23 DIAGNOSIS — E1140 Type 2 diabetes mellitus with diabetic neuropathy, unspecified: Principal | ICD-10-CM

## 2024-10-23 DIAGNOSIS — E785 Hyperlipidemia, unspecified: Secondary | ICD-10-CM

## 2024-10-23 MED ORDER — CARVEDILOL 3.125 MG PO TAB
3.125 mg | ORAL_TABLET | Freq: Two times a day (BID) | ORAL | 3 refills | 90.00000 days | Status: AC
Start: 2024-10-23 — End: ?

## 2024-10-23 MED ORDER — MOUNJARO 10 MG/0.5 ML SC PNIJ
10 mg | SUBCUTANEOUS | 5 refills | 28.00000 days | Status: AC
Start: 2024-10-23 — End: ?

## 2024-10-23 NOTE — Progress Notes [1]
 Date of Service: 10/23/2024     Subjective:             Jeremy Bullock is a 63 y.o. male.    Diabetes    Pt presents to clinic for management of T2 DM.  Last seen with me in June 2025.     Interval changes:     Switched to Mounjaro  from Ozempic  last year to worsening GI side effects on Ozempic .  These have improved on Mounjaro .     Admitted per cardiology this month for chest pressure, no intervention with cardiac cath. Adjusting BP medications.     Wt Readings from Last 5 Encounters:   10/23/24 125.6 kg (277 lb)   10/17/24 123.6 kg (272 lb 6.4 oz)   10/15/24 129 kg (284 lb 6.4 oz)   08/15/24 131.5 kg (290 lb)   05/01/24 129.5 kg (285 lb 9.6 oz)     Lab Draw:  Lab Results   Component Value Date/Time    HGBA1C 5.6 10/16/2024 03:35 AM    HGBA1C 5.6 08/19/2021 12:00 AM    HGBA1C 7.5 (H) 12/31/2015 02:34 PM     POC:  Lab Results   Component Value Date/Time    A1C 5.7 10/23/2024 12:00 AM    A1C 6.0 05/01/2024 12:00 AM    A1C 5.9 11/29/2023 09:08 AM     A1C is 5.7% in Dec 2025;  June 2025; 5.9% Jan 2025; down from 6.2% August 2024; 6.1% Feb 2024; 6.3% in August 2023 , was previously 5.6 in 08/2021.     T2 DM:   Dx: diagnosed in 2017    Current treatment:  metformin  XR 500 mg QD   Mounjaro  7.5 mg weekly , wanting to increase to help with weight loss      Medication adherent: all of the time     Past treatment:   used lantus and novolog  while inpt after CABG  Ozempic  2 mg weekly-- having some nausea     SMBG and consistency: daily One touch verio; running 90-120 with average BG 114 mg/dl     Hyperglycemia: when he eats more  Hypoglycemia: none.  Felt symptoms when blood sugars in the 90 range especially when he took metformin  and did not eat immediately.    Meals per day: 3.  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     Complications of DM:  CAD: yes, CABG in 2017  CVA: no  PVD: no  Amputations: no  Retinopathy: no  Gastropathy: no  Nephropathy: no   Neuropathy: mild     Eye exam: June 2025  -- no retinopathy noted but pt reports some spots-- will obtain copy   Foot exam  completed today-- no wounds today     Medications:  Statin: yes, taking lipitor  ACE-I or ARB: yes taking losartan   ASA: yes    Past Medical History:    Anxiety    Coronary artery disease    Depression    DM (diabetes mellitus) (CMS-HCC)    Dyslipidemia    Essential (primary) hypertension    History of tobacco use    Obstructive sleep apnea    Sleep apnea     Surgical History:   Procedure Laterality Date    Left Heart Cath With Ventriculogram N/A 01/01/2016    Performed by Cath, Physician at [facility] CATH LAB  Coronary Angiography N/A 01/01/2016    Performed by Cath, Physician at [facility] CATH LAB    Possible Percutaneous Coronary Intervention N/A 01/01/2016    Performed by Cath, Physician at [facility] CATH LAB    CORONARY ARTERY BYPASS GRAFT  01/04/16    three vessel    BYPASS GRAFT CORONARY ARTERY X3,  EVH, LIMA N/A 01/04/2016    Performed by Meriel Zachary Collins, MD at [facility] CVOR    COLONOSCOPY N/A 01/08/2016    Performed by Malissa Norleen LABOR, MD at [facility] ENDO    ANGIOGRAPHY CORONARY ARTERY WITH LEFT HEART CATHETERIZATION N/A 10/16/2024    Performed by Lannis Maude SAILOR, MD at [facility] CATH LAB    PERCUTANEOUS CORONARY STENT PLACEMENT WITH ANGIOPLASTY N/A 10/16/2024    Performed by Lannis Maude SAILOR, MD at [facility] CATH LAB    HX HEART CATHETERIZATION       Family History   Problem Relation Name Age of Onset    High Cholesterol Mother      Cancer Mother      Pulmonary HTN Mother      Diabetes Father      Coronary Artery Disease Father      Heart Attack Father       Social History     Socioeconomic History    Marital status: Married     Spouse name: Ginnie    Number of children: 3    Years of education: 14   Occupational History    Occupation: Maintenance Superintendent     Employer: [employer]   Tobacco Use    Smoking status: Never     Passive exposure: Never    Smokeless tobacco: Former     Quit date: 11/07/1996   Substance and Sexual Activity    Alcohol use: Yes     Alcohol/week: 1.0 - 2.0 standard drink of alcohol     Types: 1 - 2 Cans of beer per week     Comment: occasionally    Drug use: No    Sexual activity: Yes     Partners: Female     Labs:  Glucose, POC   Date/Time Value Ref Range Status   10/23/2024 12:00 AM 141 (A) 70 - 100 Final     Cholesterol   Date/Time Value Ref Range Status   10/16/2024 03:35 AM 93 <200 mg/dL Final     HDL   Date/Time Value Ref Range Status   10/16/2024 03:35 AM 34 (L) >40 mg/dL Final     LDL   Date/Time Value Ref Range Status   10/16/2024 03:35 AM 50.07 <100.00 mg/dL Final     Triglycerides   Date/Time Value Ref Range Status   10/16/2024 03:35 AM 132 <150 mg/dL Final     Non HDL Cholesterol   Date/Time Value Ref Range Status   10/16/2024 03:35 AM 59 mg/dL Final     Potassium   Date/Time Value Ref Range Status   10/17/2024 03:39 AM 4.0 3.5 - 5.1 mmol/L Final     Blood Urea Nitrogen   Date/Time Value Ref Range Status   10/17/2024 03:39 AM 19 7 - 25 mg/dL Final     Creatinine   Date/Time Value Ref Range Status   10/17/2024 03:39 AM 0.95 0.40 - 1.24 mg/dL Final     AST   Date/Time Value Ref Range Status   10/15/2024 08:59 PM 18 7 - 40 U/L Final     ALT   Date/Time Value Ref Range Status   10/15/2024 08:59  PM 25 7 - 56 U/L Final     TSH   Date/Time Value Ref Range Status   10/15/2024 08:59 PM 3.23 0.35 - 5.00 ?IU/mL Final     Vitamin D(25-OH)Total   Date/Time Value Ref Range Status   11/25/2016 11:58 AM 30.9 30 - 80 NG/ML Final        Review of Systems   Gastrointestinal:  Positive for constipation, diarrhea and nausea.        Intermittently with GLP1   All other systems reviewed and are negative.      Objective:          acetaminophen  (TYLENOL  EXTRA STRENGTH) 500 mg tablet Take two tablets by mouth every 6 hours as needed for Pain. Max of 4,000 mg of acetaminophen  in 24 hours.    ascorbic acid  (vitamin C ) 250 mg tablet Take one tablet by mouth daily.    aspirin  EC 81 mg tablet Take 1 Tab by mouth daily. Take with food.    atorvastatin  (LIPITOR) 40 mg tablet TAKE 1 TABLET DAILY    carvediloL  (COREG ) 3.125 mg tablet Take one tablet by mouth twice daily with meals. Take with food.    finasteride  (PROSCAR ) 5 mg tablet Take one tablet by mouth daily.    hydroCHLOROthiazide  (HYDRODIURIL ) 25 mg tablet TAKE 1 TABLET DAILY    lancets 33 gauge (ONETOUCH DELICA 33 GUAGE) 33 gauge Use one each as directed twice daily as needed. E11.65  Collaborating MD:  Dr. R Karnchanasorn    losartan  (COZAAR ) 50 mg tablet Take one tablet by mouth daily.    metFORMIN -XR (GLUCOPHAGE  XR) 500 mg extended release tablet Take one tablet by mouth daily with dinner. TAKE AS INSTRUCTED BY YOUR PRESCRIBER  Indications: type 2 diabetes mellitus (Patient taking differently: Take one-half tablet by mouth twice daily. TAKE AS INSTRUCTED BY YOUR PRESCRIBER  Indications: type 2 diabetes mellitus)    multivitamin with minerals (MULTIVITAMIN & MINERAL FORMULA PO) Take 1 tablet by mouth daily.    nitroglycerin  (NITROSTAT ) 0.4 mg tablet Place one tablet under tongue every 5 minutes as needed for Chest Pain. Max of 3 tablets, call 911.    ONETOUCH VERIO TEST STRIPS test strip USE TWICE A DAY BEFORE MEALS AS DIRECTED    ranolazine  ER (RANEXA ) 500 mg tablet Take one tablet by mouth twice daily. Indications: prevention of anginal chest pain associated with coronary artery disease    sertraline  (ZOLOFT ) 50 mg tablet TAKE ONE TABLET BY MOUTH ONCE DAILY    spironolactone  (ALDACTONE ) 25 mg tablet Take one tablet by mouth daily. Take with food.  Indications: high blood pressure    tamsulosin  (FLOMAX ) 0.4 mg capsule Take one capsule by mouth at bedtime daily.    tirzepatide  (MOUNJARO ) 7.5 mg/0.5 mL injector PEN Inject 0.5 mL under the skin every 7 days.    verapamil  CR (VERELAN ) 180 mg capsule Take one capsule by mouth daily.     Vitals:    10/23/24 0838   BP: 116/68   Pulse: 58   Temp: 36.5 ?C (97.7 ?F)   PainSc: Zero   Weight: 125.6 kg (277 lb) Height: 185.4 cm (6' 1)     Body mass index is 36.55 kg/m?SABRA     Wt Readings from Last 5 Encounters:   10/23/24 125.6 kg (277 lb)   10/17/24 123.6 kg (272 lb 6.4 oz)   10/15/24 129 kg (284 lb 6.4 oz)   08/15/24 131.5 kg (290 lb)   05/01/24 129.5 kg (285 lb 9.6 oz)  Physical Exam  Constitutional:       Appearance: He is well-developed. He is obese.   HENT:      Head: Normocephalic and atraumatic.   Neck:      Thyroid: No thyromegaly.   Pulmonary:      Effort: Pulmonary effort is normal. No respiratory distress.   Musculoskeletal:      Cervical back: Normal range of motion.   Skin:     General: Skin is warm and dry.   Neurological:      Mental Status: He is alert and oriented to person, place, and time.   Psychiatric:         Behavior: Behavior normal.       Diabetic Foot Exam       Bilateral vascular, sensation, integument are normal:  No    Vascular Status    Left:normal Right:normal   Monofilament Testing    Left:Diminished Right:Diminished   Sensation    Left:pinprick sensation normal, Right:pinprick sensation normal,   Skin Integrity    Left:Normal Right:Callous   Foot Structure    Left:Normal Right:Normal          Assessment and Plan:  1.T2 DM,  controlled   A1C is 5.7%, target less than 7.   Peripheral neuropathy     Continue metformin  XR 500 mg QD with food-- did not tolerate higher doses   Increase Mounjaro  10 mg weekly as A1c at target.     -for additional weight loss  Advised smaller portion sizes  Continue exercise and healthy eating    Annual labs UTD in March 2025     Check glucose weekly and may alternate between HS or fasting     Neuropathy: stable.     2. Obesity  Discussed patient's BMI with him.  The body mass index is 36.55 kg/m?SABRA and falls within the category of Obesity 2; BMI plan is in progress and counseled regarding weight loss.  Continue Mounjaro  7.5 mg weekly.   Encouraged TLC     3. Hypertension at goal less than 130/80.  BP Readings from Last 1 Encounters:   10/23/24 116/68   follows with Dr. Hassell with cardiology    Continue hCTZ, losartan  (switched from valsartan  by PCP recently), verapamil      4. Dyslipidemia   Lab Results   Component Value Date    CHOL 93 10/16/2024    TRIG 132 10/16/2024    HDL 34 (L) 10/16/2024    LDL 50.07 10/16/2024    VLDL 26.4 10/16/2024    NONHDLCHOL 59 10/16/2024    CHOLHDLC 3 01/12/2024   Continue lipitor 40 mg every other day--reduced from daily due to knee pain.   Encouraged TLC   UTD in March 2025     5.  Diabetes microvascular complications:  No history of retinopathy and MACR was normal Feb 2024  Annual labs UTD In March 2025   Obtaining copies of pt's eye exam.     6. OSA  Encouraging CPAP adherence     7. Fatigue  Testosterone WNL in March 2025   Ordered TSH but was not completed.     RTC in 6months     Total time:  30 min with 20 min counseling about new med             Orders Placed This Encounter    GLUCOSE METER DOWNLOAD    POC Glucose Quantitative Blood    POC Hemoglobin A1C     ATTESTATION  I personally performed or re-performed the history, physical exam and treatment for the E/M. I discussed the case with the APRN/PA student, and concur with the APRN/PA student documentation of history, physical exam and treatment plan unless otherwise noted.    Staff name:  Ulanda JONELLE Melia, APRN-NP Date: 10/23/2024

## 2024-11-15 ENCOUNTER — Encounter: Admit: 2024-11-15 | Discharge: 2024-11-15 | Payer: BLUE CROSS/BLUE SHIELD

## 2024-11-15 MED ORDER — SPIRONOLACTONE 25 MG PO TAB
25 mg | ORAL_TABLET | Freq: Every day | ORAL | 3 refills | 90.00000 days | Status: AC
Start: 2024-11-15 — End: ?

## 2024-11-15 MED ORDER — RANOLAZINE 500 MG PO TB12
500 mg | ORAL_TABLET | Freq: Two times a day (BID) | ORAL | 3 refills | 30.00000 days | Status: AC
Start: 2024-11-15 — End: ?

## 2024-11-17 ENCOUNTER — Encounter: Admit: 2024-11-17 | Discharge: 2024-11-17 | Payer: BLUE CROSS/BLUE SHIELD

## 2024-11-20 ENCOUNTER — Encounter: Admit: 2024-11-20 | Discharge: 2024-11-20 | Payer: BLUE CROSS/BLUE SHIELD

## 2024-11-22 ENCOUNTER — Encounter: Admit: 2024-11-22 | Discharge: 2024-11-22 | Payer: BLUE CROSS/BLUE SHIELD

## 2024-11-22 DIAGNOSIS — E1140 Type 2 diabetes mellitus with diabetic neuropathy, unspecified: Principal | ICD-10-CM

## 2024-11-22 MED ORDER — TRUE METRIX GLUCOSE METER MISC MISC
1 | Freq: Two times a day (BID) | 0 refills | 50.00000 days | Status: AC
Start: 2024-11-22 — End: ?

## 2024-11-22 MED ORDER — BLOOD GLUCOSE TEST MISC STRP
1 | Freq: Two times a day (BID) | 3 refills | 50.00000 days | Status: AC
Start: 2024-11-22 — End: ?

## 2024-11-22 NOTE — Telephone Encounter [36]
 Received call from [facility] [employer].  Patients insurance no longer covers One Touch Verio test strips.  They cover Freestyle Light or Truemetrix and meter is no charge.    Called patient.  Identified patient by full legal name and DOB and repeated back.  Informed him of the above.  He said that's fine.  Write for the cheapest one.  Will send script for new Trumetrix meter and test strips.

## 2024-12-06 ENCOUNTER — Encounter: Admit: 2024-12-06 | Discharge: 2024-12-06 | Payer: BLUE CROSS/BLUE SHIELD

## 2024-12-06 DIAGNOSIS — I2510 Atherosclerotic heart disease of native coronary artery without angina pectoris: Secondary | ICD-10-CM

## 2024-12-06 DIAGNOSIS — E785 Hyperlipidemia, unspecified: Secondary | ICD-10-CM

## 2024-12-06 DIAGNOSIS — I10 Essential (primary) hypertension: Principal | ICD-10-CM

## 2024-12-06 LAB — BASIC METABOLIC PANEL
ANION GAP: 8
BLD UREA NITROGEN: 11
CALCIUM: 9.5
CHLORIDE: 101
CO2: 27
CREATININE: 1.1
GFR ESTIMATED: 72
GLUCOSE,PANEL: 77
POTASSIUM: 3.7
SODIUM: 136

## 2024-12-10 ENCOUNTER — Encounter: Admit: 2024-12-10 | Discharge: 2024-12-10 | Payer: BLUE CROSS/BLUE SHIELD

## 2024-12-10 DIAGNOSIS — E785 Hyperlipidemia, unspecified: Secondary | ICD-10-CM

## 2024-12-10 DIAGNOSIS — I209 Angina pectoris, unspecified: Secondary | ICD-10-CM

## 2024-12-10 DIAGNOSIS — I10 Essential (primary) hypertension: Secondary | ICD-10-CM

## 2024-12-10 DIAGNOSIS — I2510 Atherosclerotic heart disease of native coronary artery without angina pectoris: Secondary | ICD-10-CM

## 2024-12-10 MED ORDER — EZETIMIBE 10 MG PO TAB
10 mg | ORAL_TABLET | Freq: Every day | ORAL | 1 refills | 90.00000 days | Status: AC
Start: 2024-12-10 — End: ?
